# Patient Record
Sex: Male | Born: 1960 | Race: White | Hispanic: No | Marital: Married
Health system: Southern US, Community
[De-identification: ages and names within clinical notes are randomized; demographics above are authoritative.]

## PROBLEM LIST (undated history)

## (undated) DIAGNOSIS — E669 Obesity, unspecified: Secondary | ICD-10-CM

## (undated) DIAGNOSIS — K429 Umbilical hernia without obstruction or gangrene: Secondary | ICD-10-CM

## (undated) DIAGNOSIS — G4730 Sleep apnea, unspecified: Secondary | ICD-10-CM

## (undated) DIAGNOSIS — M1711 Unilateral primary osteoarthritis, right knee: Secondary | ICD-10-CM

## (undated) DIAGNOSIS — M1712 Unilateral primary osteoarthritis, left knee: Secondary | ICD-10-CM

## (undated) DIAGNOSIS — S82899A Other fracture of unspecified lower leg, initial encounter for closed fracture: Secondary | ICD-10-CM

## (undated) DIAGNOSIS — M1990 Unspecified osteoarthritis, unspecified site: Secondary | ICD-10-CM

## (undated) DIAGNOSIS — I10 Essential (primary) hypertension: Secondary | ICD-10-CM

## (undated) DIAGNOSIS — E7800 Pure hypercholesterolemia, unspecified: Secondary | ICD-10-CM

## (undated) HISTORY — PX: COLONOSCOPY W/ BIOPSIES AND POLYPECTOMY: SHX1376

---
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
## 1983-03-02 HISTORY — PX: HERNIA REPAIR: SHX51

## 2016-03-02 ENCOUNTER — Other Ambulatory Visit: Payer: Self-pay | Admitting: Orthopedic Surgery

## 2016-03-04 NOTE — Pre-Procedure Instructions (Signed)
Marcas Babcock  03/04/2016      [id number] - [name], [place] [address] [place] [address] Phone: [phone] Fax: [phone]    Your procedure is scheduled on January 16  Report to [place] at 0830 A.M.  Call this number if you have problems the morning of surgery:  [phone]   Remember:  Do not eat food or drink liquids after midnight.   Take these medicines the morning of surgery with A SIP OF WATER cetirizine (ZYRTEC), methocarbamol (ROBAXIN) if needed, traMADol (ULTRAM  7 days prior to surgery STOP taking any celecoxib (CELEBREX)  Aspirin, Aleve, Naproxen, Ibuprofen, Motrin, Advil, Goody's, BC's, all herbal medications, fish oil, and all vitamins    Do not wear jewelry.  Do not wear lotions, powders, or cologne, or deoderant.  Men may shave face and neck.  Do not bring valuables to the hospital.  [facility] is not responsible for any belongings or valuables.  Contacts, dentures or bridgework may not be worn into surgery.  Leave your suitcase in the car.  After surgery it may be brought to your room.  For patients admitted to the hospital, discharge time will be determined by your treatment team.  Patients discharged the day of surgery will not be allowed to drive home.    Special instructions:   [place]- Preparing For Surgery  Before surgery, you can play an important role. Because skin is not sterile, your skin needs to be as free of germs as possible. You can reduce the number of germs on your skin by washing with CHG (chlorahexidine gluconate) Soap before surgery.  CHG is an antiseptic cleaner which kills germs and bonds with the skin to continue killing germs even after washing.  Please do not use if you have an allergy to CHG or antibacterial soaps. If your skin becomes reddened/irritated stop using the CHG.  Do not shave (including legs and underarms) for at least 48 hours prior to  first CHG shower. It is OK to shave your face.  Please follow these instructions carefully.   1. Shower the NIGHT BEFORE SURGERY and the MORNING OF SURGERY with CHG.   2. If you chose to wash your hair, wash your hair first as usual with your normal shampoo.  3. After you shampoo, rinse your hair and body thoroughly to remove the shampoo.  4. Use CHG as you would any other liquid soap. You can apply CHG directly to the skin and wash gently with a scrungie or a clean washcloth.   5. Apply the CHG Soap to your body ONLY FROM THE NECK DOWN.  Do not use on open wounds or open sores. Avoid contact with your eyes, ears, mouth and genitals (private parts). Wash genitals (private parts) with your normal soap.  6. Wash thoroughly, paying special attention to the area where your surgery will be performed.  7. Thoroughly rinse your body with warm water from the neck down.  8. DO NOT shower/wash with your normal soap after using and rinsing off the CHG Soap.  9. Pat yourself dry with a CLEAN TOWEL.   10. Wear CLEAN PAJAMAS   11. Place CLEAN SHEETS on your bed the night of your first shower and DO NOT SLEEP WITH PETS.    Day of Surgery: Do not apply any deodorants/lotions. Please wear clean clothes to the hospital/surgery center.      Please read over the following fact sheets that you  were given.

## 2016-03-05 ENCOUNTER — Encounter (HOSPITAL_COMMUNITY)
Admission: RE | Admit: 2016-03-05 | Discharge: 2016-03-05 | Disposition: A | Discharge status: Home / Self Care | Payer: 59 | Source: Ambulatory Visit | Attending: Orthopedic Surgery | Admitting: Orthopedic Surgery

## 2016-03-05 ENCOUNTER — Encounter (HOSPITAL_COMMUNITY): Payer: Self-pay

## 2016-03-05 DIAGNOSIS — R7989 Other specified abnormal findings of blood chemistry: Secondary | ICD-10-CM | POA: Insufficient documentation

## 2016-03-05 HISTORY — DX: Unspecified osteoarthritis, unspecified site: M19.90

## 2016-03-05 HISTORY — DX: Pure hypercholesterolemia, unspecified: E78.00

## 2016-03-05 HISTORY — DX: Essential (primary) hypertension: I10

## 2016-03-05 HISTORY — DX: Other fracture of unspecified lower leg, initial encounter for closed fracture: S82.899A

## 2016-03-05 LAB — SURGICAL PCR SCREEN
MRSA, PCR: NEGATIVE
Staphylococcus aureus: NEGATIVE

## 2016-03-05 LAB — BASIC METABOLIC PANEL
ANION GAP: 10 (ref 5–15)
BUN: 13 mg/dL (ref 6–20)
CALCIUM: 9.4 mg/dL (ref 8.9–10.3)
CHLORIDE: 104 mmol/L (ref 101–111)
CO2: 24 mmol/L (ref 22–32)
Creatinine, Ser: 0.78 mg/dL (ref 0.61–1.24)
GFR calc Af Amer: >60 mL/min (ref >60)
GFR calc non Af Amer: >60 mL/min (ref >60)
Glucose, Bld: 91 mg/dL (ref 65–99)
POTASSIUM: 3.8 mmol/L (ref 3.5–5.1)
Sodium: 138 mmol/L (ref 135–145)

## 2016-03-05 LAB — CBC
HEMATOCRIT: 39.9 % (ref 39.0–52.0)
Hemoglobin: 13.6 g/dL (ref 13.0–17.0)
MCH: 30.3 pg (ref 26.0–34.0)
MCHC: 34.1 g/dL (ref 30.0–36.0)
MCV: 88.9 fL (ref 78.0–100.0)
Platelets: 221 10*3/uL (ref 150–400)
RBC: 4.49 MIL/uL (ref 4.22–5.81)
RDW: 12.6 % (ref 11.5–15.5)
WBC: 7.2 10*3/uL (ref 4.0–10.5)

## 2016-03-05 NOTE — Progress Notes (Signed)
PCP - Hillary Gordnier Cardiologist - denies  Chest x-ray - not needed EKG - 03/03/16 - requesting from PCP Stress Test - denies ECHO - denies Cardiac Cath - denies  Will send to anesthesia for review of ekg    Patient denies shortness of breath, fever, cough and chest pain at PAT appointment

## 2016-03-05 NOTE — Progress Notes (Signed)
   03/05/16 1050  OBSTRUCTIVE SLEEP APNEA  Have you ever been diagnosed with sleep apnea through a sleep study? No  Do you snore loudly (loud enough to be heard through closed doors)?  1  Do you often feel tired, fatigued, or sleepy during the daytime (such as falling asleep during driving or talking to someone)? 0  Has anyone observed you stop breathing during your sleep? 0  Do you have, or are you being treated for high blood pressure? 1  BMI more than 35 kg/m2? 1  Age > 50 (1-yes) 1  Neck circumference greater than:Male 16 inches or larger, Male 17inches or larger? 1  Male Gender (Yes=1) 1  Obstructive Sleep Apnea Score 6  Score 5 or greater  Results sent to PCP

## 2016-03-15 MED ORDER — CEFAZOLIN SODIUM 10 G IJ SOLR
3.0000 g | INTRAMUSCULAR | Status: AC
  Filled 2016-03-15: qty 3000

## 2016-03-25 ENCOUNTER — Encounter (HOSPITAL_COMMUNITY): Payer: Self-pay | Admitting: *Deleted

## 2016-03-25 NOTE — Progress Notes (Signed)
   03/25/16 1414  OBSTRUCTIVE SLEEP APNEA  Have you ever been diagnosed with sleep apnea through a sleep study? No  Do you snore loudly (loud enough to be heard through closed doors)?  1  Do you often feel tired, fatigued, or sleepy during the daytime (such as falling asleep during driving or talking to someone)? 0  Has anyone observed you stop breathing during your sleep? 0  Do you have, or are you being treated for high blood pressure? 1  BMI more than 35 kg/m2? 1  Age > 50 (1-yes) 1  Neck circumference greater than:Male 16 inches or larger, Male 17inches or larger? 1  Male Gender (Yes=1) 1  Obstructive Sleep Apnea Score 6

## 2016-03-25 NOTE — Progress Notes (Signed)
Pt SDW-pre-op call completed by pt spouse [name] per DPR. Spouse stated that everything is the same since PAT visit on 03/05/16. Spouse denies pt had a cardiac cath but stated that a stress test was performed >6 years ago. Spouse stated that pt has stopped taking  Aspirin, vitamins, Cellebrex and herbal medications and NSAID's such as  Ibuprofen, Advil, Naproxen, BC and Goody Powder or any medication containing Aspirin per previous pre-op instructions. Spouse verbalized understanding of all pre-op instructions.

## 2016-03-26 ENCOUNTER — Observation Stay (HOSPITAL_COMMUNITY): Payer: 59

## 2016-03-26 ENCOUNTER — Encounter (HOSPITAL_COMMUNITY)
Admission: RE | Disposition: A | Discharge status: Home / Self Care | Payer: Self-pay | Source: Ambulatory Visit | Attending: Orthopedic Surgery

## 2016-03-26 ENCOUNTER — Ambulatory Visit (HOSPITAL_COMMUNITY): Payer: 59 | Admitting: Certified Registered Nurse Anesthetist

## 2016-03-26 ENCOUNTER — Encounter (HOSPITAL_COMMUNITY): Payer: Self-pay | Admitting: *Deleted

## 2016-03-26 ENCOUNTER — Ambulatory Visit (HOSPITAL_COMMUNITY): Payer: 59 | Admitting: Emergency Medicine

## 2016-03-26 ENCOUNTER — Observation Stay (HOSPITAL_COMMUNITY)
Admission: RE | Admit: 2016-03-26 | Discharge: 2016-03-27 | Disposition: A | Discharge status: Home / Self Care | Payer: 59 | Source: Ambulatory Visit | Attending: Orthopedic Surgery | Admitting: Orthopedic Surgery

## 2016-03-26 DIAGNOSIS — Z86010 Personal history of colonic polyps: Secondary | ICD-10-CM | POA: Diagnosis not present

## 2016-03-26 DIAGNOSIS — Z6841 Body Mass Index (BMI) 40.0 and over, adult: Secondary | ICD-10-CM | POA: Diagnosis not present

## 2016-03-26 DIAGNOSIS — F17290 Nicotine dependence, other tobacco product, uncomplicated: Secondary | ICD-10-CM | POA: Insufficient documentation

## 2016-03-26 DIAGNOSIS — E7800 Pure hypercholesterolemia, unspecified: Secondary | ICD-10-CM | POA: Diagnosis not present

## 2016-03-26 DIAGNOSIS — Z9889 Other specified postprocedural states: Secondary | ICD-10-CM | POA: Diagnosis not present

## 2016-03-26 DIAGNOSIS — R262 Difficulty in walking, not elsewhere classified: Secondary | ICD-10-CM

## 2016-03-26 DIAGNOSIS — M1712 Unilateral primary osteoarthritis, left knee: Secondary | ICD-10-CM | POA: Diagnosis present

## 2016-03-26 DIAGNOSIS — I10 Essential (primary) hypertension: Secondary | ICD-10-CM | POA: Diagnosis not present

## 2016-03-26 DIAGNOSIS — Z96652 Presence of left artificial knee joint: Secondary | ICD-10-CM

## 2016-03-26 DIAGNOSIS — M25662 Stiffness of left knee, not elsewhere classified: Secondary | ICD-10-CM

## 2016-03-26 HISTORY — DX: Morbid (severe) obesity due to excess calories: E66.01

## 2016-03-26 HISTORY — DX: Obesity, unspecified: E66.9

## 2016-03-26 HISTORY — PX: REPLACEMENT UNICONDYLAR JOINT KNEE: SUR1227

## 2016-03-26 HISTORY — PX: PARTIAL KNEE ARTHROPLASTY: SHX2174

## 2016-03-26 HISTORY — DX: Unilateral primary osteoarthritis, left knee: M17.12

## 2016-03-26 LAB — CBC
HEMATOCRIT: 41.8 % (ref 39.0–52.0)
Hemoglobin: 14 g/dL (ref 13.0–17.0)
MCH: 30.6 pg (ref 26.0–34.0)
MCHC: 33.5 g/dL (ref 30.0–36.0)
MCV: 91.3 fL (ref 78.0–100.0)
Platelets: 269 10*3/uL (ref 150–400)
RBC: 4.58 MIL/uL (ref 4.22–5.81)
RDW: 13.1 % (ref 11.5–15.5)
WBC: 8.3 10*3/uL (ref 4.0–10.5)

## 2016-03-26 LAB — BASIC METABOLIC PANEL
Anion gap: 10 (ref 5–15)
BUN: 17 mg/dL (ref 6–20)
CO2: 27 mmol/L (ref 22–32)
Calcium: 9.3 mg/dL (ref 8.9–10.3)
Chloride: 102 mmol/L (ref 101–111)
Creatinine, Ser: 0.93 mg/dL (ref 0.61–1.24)
GFR calc Af Amer: >60 mL/min (ref >60)
GFR calc non Af Amer: >60 mL/min (ref >60)
GLUCOSE: 101 mg/dL — AB (ref 65–99)
POTASSIUM: 3.7 mmol/L (ref 3.5–5.1)
Sodium: 139 mmol/L (ref 135–145)

## 2016-03-26 SURGERY — ARTHROPLASTY, KNEE, UNICOMPARTMENTAL
Anesthesia: Spinal | Site: Knee | Laterality: Left

## 2016-03-26 MED ORDER — DIPHENHYDRAMINE HCL 12.5 MG/5ML PO ELIX: 12.5000 mg | ORAL_SOLUTION | ORAL | Status: DC | PRN

## 2016-03-26 MED ORDER — KETOROLAC TROMETHAMINE 30 MG/ML IJ SOLN
INTRAMUSCULAR | Status: AC
  Filled 2016-03-26: qty 1

## 2016-03-26 MED ORDER — ACETAMINOPHEN 650 MG RE SUPP: 650.0000 mg | Freq: Four times a day (QID) | RECTAL | Status: DC | PRN

## 2016-03-26 MED ORDER — METHOCARBAMOL 500 MG PO TABS
ORAL_TABLET | ORAL | Status: AC
  Administered 2016-03-26: 500 mg via ORAL
  Filled 2016-03-26: qty 1

## 2016-03-26 MED ORDER — LISINOPRIL-HYDROCHLOROTHIAZIDE 20-12.5 MG PO TABS: 1.0000 | ORAL_TABLET | Freq: Two times a day (BID) | ORAL | Status: DC

## 2016-03-26 MED ORDER — MIDAZOLAM HCL 2 MG/2ML IJ SOLN
INTRAMUSCULAR | Status: AC
  Filled 2016-03-26: qty 2

## 2016-03-26 MED ORDER — METOCLOPRAMIDE HCL 5 MG/ML IJ SOLN: 10.0000 mg | Freq: Once | INTRAMUSCULAR | Status: DC | PRN

## 2016-03-26 MED ORDER — PROPOFOL 1000 MG/100ML IV EMUL
INTRAVENOUS | Status: AC
  Filled 2016-03-26: qty 300

## 2016-03-26 MED ORDER — 0.9 % SODIUM CHLORIDE (POUR BTL) OPTIME
TOPICAL | Status: DC | PRN
  Administered 2016-03-26: 1000 mL

## 2016-03-26 MED ORDER — MAGNESIUM CITRATE PO SOLN: 1.0000 | Freq: Once | ORAL | Status: DC | PRN

## 2016-03-26 MED ORDER — OXYCODONE HCL 5 MG PO TABS
ORAL_TABLET | ORAL | Status: AC
  Filled 2016-03-26: qty 2

## 2016-03-26 MED ORDER — LACTATED RINGERS IV SOLN
INTRAVENOUS | Status: DC
  Administered 2016-03-26: 12:00:00 via INTRAVENOUS

## 2016-03-26 MED ORDER — ONDANSETRON HCL 4 MG PO TABS: 4.0000 mg | ORAL_TABLET | Freq: Three times a day (TID) | ORAL | 0 refills | Status: DC | PRN

## 2016-03-26 MED ORDER — RIVAROXABAN 10 MG PO TABS
10.0000 mg | ORAL_TABLET | Freq: Every day | ORAL | Status: DC
  Administered 2016-03-27: 10 mg via ORAL
  Filled 2016-03-26: qty 1

## 2016-03-26 MED ORDER — TRAMADOL HCL 50 MG PO TABS
50.0000 mg | ORAL_TABLET | Freq: Three times a day (TID) | ORAL | Status: DC | PRN
  Administered 2016-03-26: 50 mg via ORAL

## 2016-03-26 MED ORDER — HYDROMORPHONE HCL 1 MG/ML IJ SOLN
INTRAMUSCULAR | Status: AC
  Filled 2016-03-26: qty 0.5

## 2016-03-26 MED ORDER — METHOCARBAMOL 500 MG PO TABS
500.0000 mg | ORAL_TABLET | Freq: Four times a day (QID) | ORAL | Status: DC | PRN
  Administered 2016-03-26 – 2016-03-27 (×2): 500 mg via ORAL
  Filled 2016-03-26: qty 1

## 2016-03-26 MED ORDER — SODIUM CHLORIDE 0.9 % IR SOLN
Status: DC | PRN
  Administered 2016-03-26: 1000 mL

## 2016-03-26 MED ORDER — HYDROMORPHONE HCL 1 MG/ML IJ SOLN
1.0000 mg | INTRAMUSCULAR | Status: AC | PRN
  Administered 2016-03-26: 1 mg via INTRAVENOUS

## 2016-03-26 MED ORDER — SENNA-DOCUSATE SODIUM 8.6-50 MG PO TABS: 2.0000 | ORAL_TABLET | Freq: Every day | ORAL | 1 refills | Status: DC

## 2016-03-26 MED ORDER — LISINOPRIL 20 MG PO TABS
20.0000 mg | ORAL_TABLET | Freq: Every day | ORAL | Status: DC
  Administered 2016-03-27: 20 mg via ORAL
  Filled 2016-03-26: qty 1

## 2016-03-26 MED ORDER — KETOROLAC TROMETHAMINE 15 MG/ML IJ SOLN
7.5000 mg | Freq: Four times a day (QID) | INTRAMUSCULAR | Status: AC
  Administered 2016-03-26 – 2016-03-27 (×4): 7.5 mg via INTRAVENOUS
  Filled 2016-03-26 (×3): qty 1

## 2016-03-26 MED ORDER — PHENYLEPHRINE 40 MCG/ML (10ML) SYRINGE FOR IV PUSH (FOR BLOOD PRESSURE SUPPORT)
PREFILLED_SYRINGE | INTRAVENOUS | Status: AC
  Filled 2016-03-26: qty 10

## 2016-03-26 MED ORDER — ACETAMINOPHEN 325 MG PO TABS: 650.0000 mg | ORAL_TABLET | Freq: Four times a day (QID) | ORAL | Status: DC | PRN

## 2016-03-26 MED ORDER — MEPERIDINE HCL 25 MG/ML IJ SOLN: 6.2500 mg | INTRAMUSCULAR | Status: DC | PRN

## 2016-03-26 MED ORDER — ADULT MULTIVITAMIN W/MINERALS CH
1.0000 | ORAL_TABLET | Freq: Every day | ORAL | Status: DC
  Administered 2016-03-27: 1 via ORAL
  Filled 2016-03-26: qty 1

## 2016-03-26 MED ORDER — HYDROMORPHONE HCL 1 MG/ML IJ SOLN
INTRAMUSCULAR | Status: AC
  Administered 2016-03-26: 0.5 mg via INTRAVENOUS
  Filled 2016-03-26: qty 0.5

## 2016-03-26 MED ORDER — METOCLOPRAMIDE HCL 5 MG/ML IJ SOLN: 5.0000 mg | Freq: Three times a day (TID) | INTRAMUSCULAR | Status: DC | PRN

## 2016-03-26 MED ORDER — LORATADINE 10 MG PO TABS
10.0000 mg | ORAL_TABLET | Freq: Every day | ORAL | Status: DC
  Administered 2016-03-27: 10 mg via ORAL
  Filled 2016-03-26: qty 1

## 2016-03-26 MED ORDER — RIVAROXABAN 10 MG PO TABS: 10.0000 mg | ORAL_TABLET | Freq: Every day | ORAL | 0 refills | Status: DC

## 2016-03-26 MED ORDER — BISACODYL 10 MG RE SUPP: 10.0000 mg | Freq: Every day | RECTAL | Status: DC | PRN

## 2016-03-26 MED ORDER — FENTANYL CITRATE (PF) 100 MCG/2ML IJ SOLN
INTRAMUSCULAR | Status: AC
  Administered 2016-03-26: 50 ug via INTRAVENOUS
  Filled 2016-03-26: qty 2

## 2016-03-26 MED ORDER — BUPIVACAINE HCL (PF) 0.25 % IJ SOLN
INTRAMUSCULAR | Status: AC
  Filled 2016-03-26: qty 30

## 2016-03-26 MED ORDER — ONDANSETRON HCL 4 MG PO TABS: 4.0000 mg | ORAL_TABLET | Freq: Four times a day (QID) | ORAL | Status: DC | PRN

## 2016-03-26 MED ORDER — ADULT MULTIVITAMIN W/MINERALS CH: 1.0000 | ORAL_TABLET | Freq: Two times a day (BID) | ORAL | Status: DC

## 2016-03-26 MED ORDER — DEXTROSE 5 % IV SOLN
INTRAVENOUS | Status: DC | PRN
  Administered 2016-03-26: 3 g via INTRAVENOUS

## 2016-03-26 MED ORDER — OXYCODONE-ACETAMINOPHEN 10-325 MG PO TABS: 1.0000 | ORAL_TABLET | Freq: Four times a day (QID) | ORAL | 0 refills | Status: DC | PRN

## 2016-03-26 MED ORDER — MIDAZOLAM HCL 2 MG/2ML IJ SOLN
INTRAMUSCULAR | Status: AC
  Administered 2016-03-26: 2 mg
  Filled 2016-03-26: qty 2

## 2016-03-26 MED ORDER — FENTANYL CITRATE (PF) 100 MCG/2ML IJ SOLN
INTRAMUSCULAR | Status: AC
  Filled 2016-03-26: qty 2

## 2016-03-26 MED ORDER — PHENYLEPHRINE HCL 10 MG/ML IJ SOLN
INTRAMUSCULAR | Status: DC | PRN
  Administered 2016-03-26: 50 ug/min via INTRAVENOUS

## 2016-03-26 MED ORDER — MIDAZOLAM HCL 5 MG/5ML IJ SOLN
INTRAMUSCULAR | Status: DC | PRN
  Administered 2016-03-26: 2 mg via INTRAVENOUS

## 2016-03-26 MED ORDER — MENTHOL 3 MG MT LOZG
1.0000 | LOZENGE | OROMUCOSAL | Status: DC | PRN
Start: 2016-03-26 — End: 2016-03-27

## 2016-03-26 MED ORDER — HYDROCHLOROTHIAZIDE 12.5 MG PO CAPS
12.5000 mg | ORAL_CAPSULE | Freq: Every day | ORAL | Status: DC
  Administered 2016-03-27: 12.5 mg via ORAL
  Filled 2016-03-26: qty 1

## 2016-03-26 MED ORDER — ALUM & MAG HYDROXIDE-SIMETH 200-200-20 MG/5ML PO SUSP: 30.0000 mL | ORAL | Status: DC | PRN

## 2016-03-26 MED ORDER — METHOCARBAMOL 500 MG PO TABS: 500.0000 mg | ORAL_TABLET | Freq: Three times a day (TID) | ORAL | 0 refills | Status: DC | PRN

## 2016-03-26 MED ORDER — SENNA 8.6 MG PO TABS
1.0000 | ORAL_TABLET | Freq: Two times a day (BID) | ORAL | Status: DC
  Administered 2016-03-27: 8.6 mg via ORAL
  Filled 2016-03-26: qty 1

## 2016-03-26 MED ORDER — ONDANSETRON HCL 4 MG/2ML IJ SOLN: 4.0000 mg | Freq: Four times a day (QID) | INTRAMUSCULAR | Status: DC | PRN

## 2016-03-26 MED ORDER — DEXAMETHASONE SODIUM PHOSPHATE 10 MG/ML IJ SOLN
10.0000 mg | Freq: Once | INTRAMUSCULAR | Status: AC
  Administered 2016-03-27: 10 mg via INTRAVENOUS
  Filled 2016-03-26: qty 1

## 2016-03-26 MED ORDER — SIMVASTATIN 40 MG PO TABS: 40.0000 mg | ORAL_TABLET | Freq: Every day | ORAL | Status: DC

## 2016-03-26 MED ORDER — KETOROLAC TROMETHAMINE 15 MG/ML IJ SOLN
INTRAMUSCULAR | Status: AC
  Filled 2016-03-26: qty 1

## 2016-03-26 MED ORDER — BUPIVACAINE-EPINEPHRINE (PF) 0.5% -1:200000 IJ SOLN
INTRAMUSCULAR | Status: DC | PRN
  Administered 2016-03-26: 30 mL via PERINEURAL

## 2016-03-26 MED ORDER — HYDROMORPHONE HCL 2 MG/ML IJ SOLN
0.5000 mg | INTRAMUSCULAR | Status: DC | PRN
  Administered 2016-03-26: 0.5 mg via INTRAVENOUS
  Filled 2016-03-26: qty 1

## 2016-03-26 MED ORDER — PHENYLEPHRINE HCL 10 MG/ML IJ SOLN
INTRAMUSCULAR | Status: DC | PRN
  Administered 2016-03-26: 80 ug via INTRAVENOUS

## 2016-03-26 MED ORDER — FENTANYL CITRATE (PF) 100 MCG/2ML IJ SOLN
25.0000 ug | INTRAMUSCULAR | Status: DC | PRN
  Administered 2016-03-26 (×3): 50 ug via INTRAVENOUS

## 2016-03-26 MED ORDER — TRAMADOL HCL 50 MG PO TABS
ORAL_TABLET | ORAL | Status: AC
  Filled 2016-03-26: qty 1

## 2016-03-26 MED ORDER — LACTATED RINGERS IV SOLN: INTRAVENOUS | Status: DC

## 2016-03-26 MED ORDER — FENTANYL CITRATE (PF) 100 MCG/2ML IJ SOLN
INTRAMUSCULAR | Status: DC | PRN
  Administered 2016-03-26: 100 ug via INTRAVENOUS

## 2016-03-26 MED ORDER — GABAPENTIN 300 MG PO CAPS
300.0000 mg | ORAL_CAPSULE | Freq: Two times a day (BID) | ORAL | Status: DC
  Administered 2016-03-26 – 2016-03-27 (×2): 300 mg via ORAL
  Filled 2016-03-26 (×2): qty 1

## 2016-03-26 MED ORDER — PROPOFOL 500 MG/50ML IV EMUL
INTRAVENOUS | Status: DC | PRN
  Administered 2016-03-26: 75 ug/kg/min via INTRAVENOUS

## 2016-03-26 MED ORDER — CEFAZOLIN SODIUM 1 G IJ SOLR
INTRAMUSCULAR | Status: AC
  Filled 2016-03-26: qty 30

## 2016-03-26 MED ORDER — HYDROMORPHONE HCL 2 MG/ML IJ SOLN
INTRAMUSCULAR | Status: AC
  Filled 2016-03-26: qty 1

## 2016-03-26 MED ORDER — CALCIUM CITRATE-VITAMIN D 500-400 MG-UNIT PO CHEW
1.0000 | CHEWABLE_TABLET | Freq: Two times a day (BID) | ORAL | Status: DC
  Filled 2016-03-26: qty 1

## 2016-03-26 MED ORDER — FENTANYL CITRATE (PF) 100 MCG/2ML IJ SOLN
INTRAMUSCULAR | Status: AC
  Administered 2016-03-26: 100 ug
  Filled 2016-03-26: qty 2

## 2016-03-26 MED ORDER — HYDROMORPHONE HCL 1 MG/ML IJ SOLN
0.5000 mg | INTRAMUSCULAR | Status: DC | PRN
  Administered 2016-03-26 (×2): 0.5 mg via INTRAVENOUS

## 2016-03-26 MED ORDER — METOCLOPRAMIDE HCL 5 MG PO TABS: 5.0000 mg | ORAL_TABLET | Freq: Three times a day (TID) | ORAL | Status: DC | PRN

## 2016-03-26 MED ORDER — DOCUSATE SODIUM 100 MG PO CAPS
100.0000 mg | ORAL_CAPSULE | Freq: Two times a day (BID) | ORAL | Status: DC
  Administered 2016-03-26 – 2016-03-27 (×2): 100 mg via ORAL
  Filled 2016-03-26 (×2): qty 1

## 2016-03-26 MED ORDER — LIDOCAINE HCL (CARDIAC) 20 MG/ML IV SOLN
INTRAVENOUS | Status: DC | PRN
  Administered 2016-03-26: 40 mg via INTRAVENOUS

## 2016-03-26 MED ORDER — OXYCODONE HCL 5 MG PO TABS
5.0000 mg | ORAL_TABLET | ORAL | Status: DC | PRN
  Administered 2016-03-26 – 2016-03-27 (×5): 10 mg via ORAL
  Filled 2016-03-26 (×3): qty 2

## 2016-03-26 MED ORDER — PHENYLEPHRINE HCL 10 MG/ML IJ SOLN
INTRAMUSCULAR | Status: AC
  Filled 2016-03-26: qty 1

## 2016-03-26 MED ORDER — METHOCARBAMOL 1000 MG/10ML IJ SOLN
500.0000 mg | Freq: Four times a day (QID) | INTRAMUSCULAR | Status: DC | PRN
  Filled 2016-03-26: qty 5

## 2016-03-26 MED ORDER — POLYETHYLENE GLYCOL 3350 17 G PO PACK: 17.0000 g | PACK | Freq: Every day | ORAL | Status: DC | PRN

## 2016-03-26 MED ORDER — POTASSIUM CHLORIDE IN NACL 20-0.45 MEQ/L-% IV SOLN
INTRAVENOUS | Status: DC
  Administered 2016-03-26: 23:00:00 via INTRAVENOUS
  Filled 2016-03-26 (×2): qty 1000

## 2016-03-26 MED ORDER — PROPOFOL 10 MG/ML IV BOLUS
INTRAVENOUS | Status: DC | PRN
  Administered 2016-03-26: 20 mg via INTRAVENOUS

## 2016-03-26 MED ORDER — CEFAZOLIN SODIUM-DEXTROSE 2-4 GM/100ML-% IV SOLN
2.0000 g | Freq: Four times a day (QID) | INTRAVENOUS | Status: AC
  Administered 2016-03-26 – 2016-03-27 (×2): 2 g via INTRAVENOUS
  Filled 2016-03-26 (×2): qty 100

## 2016-03-26 MED ORDER — LACTATED RINGERS IV SOLN
INTRAVENOUS | Status: DC | PRN
  Administered 2016-03-26 (×2): via INTRAVENOUS

## 2016-03-26 MED ORDER — PHENOL 1.4 % MT LIQD
1.0000 | OROMUCOSAL | Status: DC | PRN
Start: 2016-03-26 — End: 2016-03-27

## 2016-03-26 SURGICAL SUPPLY — 56 items
BANDAGE ESMARK 6X9 LF (GAUZE/BANDAGES/DRESSINGS) ×1 IMPLANT
BNDG ELASTIC 6X15 VLCR STRL LF (GAUZE/BANDAGES/DRESSINGS) ×3 IMPLANT
BNDG ESMARK 6X9 LF (GAUZE/BANDAGES/DRESSINGS) ×3
BONE CEMENT PALACOSE (Cement) ×3 IMPLANT
BOWL SMART MIX CTS (DISPOSABLE) ×3 IMPLANT
CAPT KNEE PARTIAL 2 ×3 IMPLANT
CEMENT BONE PALACOSE (Cement) ×1 IMPLANT
CLOSURE STERI-STRIP 1/2X4 (GAUZE/BANDAGES/DRESSINGS) ×1
CLSR STERI-STRIP ANTIMIC 1/2X4 (GAUZE/BANDAGES/DRESSINGS) ×2 IMPLANT
COVER SURGICAL LIGHT HANDLE (MISCELLANEOUS) ×3 IMPLANT
CUFF TOURNIQUET SINGLE 34IN LL (TOURNIQUET CUFF) ×3 IMPLANT
DRAPE EXTREMITY T 121X128X90 (DRAPE) IMPLANT
DRAPE HALF SHEET 40X57 (DRAPES) ×3 IMPLANT
DRAPE PROXIMA HALF (DRAPES) IMPLANT
DRAPE U-SHAPE 47X51 STRL (DRAPES) ×3 IMPLANT
DRSG PAD ABDOMINAL 8X10 ST (GAUZE/BANDAGES/DRESSINGS) ×3 IMPLANT
DURAPREP 26ML APPLICATOR (WOUND CARE) ×3 IMPLANT
ELECT CAUTERY BLADE 6.4 (BLADE) ×3 IMPLANT
ELECT REM PT RETURN 9FT ADLT (ELECTROSURGICAL) ×3
ELECTRODE REM PT RTRN 9FT ADLT (ELECTROSURGICAL) ×1 IMPLANT
GAUZE SPONGE 4X4 12PLY STRL (GAUZE/BANDAGES/DRESSINGS) ×3 IMPLANT
GLOVE BIOGEL PI ORTHO PRO SZ8 (GLOVE) ×4
GLOVE ORTHO TXT STRL SZ7.5 (GLOVE) ×3 IMPLANT
GLOVE PI ORTHO PRO STRL SZ8 (GLOVE) ×2 IMPLANT
GLOVE SURG ORTHO 8.0 STRL STRW (GLOVE) ×3 IMPLANT
GOWN STRL REUS W/ TWL XL LVL3 (GOWN DISPOSABLE) ×1 IMPLANT
GOWN STRL REUS W/TWL 2XL LVL3 (GOWN DISPOSABLE) ×3 IMPLANT
GOWN STRL REUS W/TWL XL LVL3 (GOWN DISPOSABLE) ×2
HANDPIECE INTERPULSE COAX TIP (DISPOSABLE) ×2
HOOD PEEL AWAY FACE SHEILD DIS (HOOD) ×6 IMPLANT
IMMOBILIZER KNEE 22 UNIV (SOFTGOODS) ×3 IMPLANT
KIT BASIN OR (CUSTOM PROCEDURE TRAY) ×3 IMPLANT
KIT ROOM TURNOVER OR (KITS) ×3 IMPLANT
MANIFOLD NEPTUNE II (INSTRUMENTS) ×3 IMPLANT
NEEDLE HYPO 21X1.5 SAFETY (NEEDLE) IMPLANT
NS IRRIG 1000ML POUR BTL (IV SOLUTION) ×3 IMPLANT
PACK TOTAL JOINT (CUSTOM PROCEDURE TRAY) ×3 IMPLANT
PAD ABD 8X10 STRL (GAUZE/BANDAGES/DRESSINGS) ×3 IMPLANT
PAD ARMBOARD 7.5X6 YLW CONV (MISCELLANEOUS) ×6 IMPLANT
PAD CAST 4YDX4 CTTN HI CHSV (CAST SUPPLIES) ×1 IMPLANT
PADDING CAST COTTON 4X4 STRL (CAST SUPPLIES) ×2
PADDING CAST COTTON 6X4 STRL (CAST SUPPLIES) ×3 IMPLANT
PENCIL BUTTON HOLSTER BLD 10FT (ELECTRODE) ×3 IMPLANT
SET HNDPC FAN SPRY TIP SCT (DISPOSABLE) ×1 IMPLANT
SPONGE GAUZE 4X4 12PLY STER LF (GAUZE/BANDAGES/DRESSINGS) ×3 IMPLANT
SUCTION FRAZIER HANDLE 10FR (MISCELLANEOUS) ×2
SUCTION TUBE FRAZIER 10FR DISP (MISCELLANEOUS) ×1 IMPLANT
SUT MNCRL AB 4-0 PS2 18 (SUTURE) IMPLANT
SUT VIC AB 0 CT1 27 (SUTURE) ×2
SUT VIC AB 0 CT1 27XBRD ANBCTR (SUTURE) ×1 IMPLANT
SUT VIC AB 1 CT1 27 (SUTURE) ×2
SUT VIC AB 1 CT1 27XBRD ANBCTR (SUTURE) ×1 IMPLANT
SUT VIC AB 3-0 SH 8-18 (SUTURE) ×3 IMPLANT
SYR CONTROL 10ML LL (SYRINGE) IMPLANT
TOWEL OR 17X24 6PK STRL BLUE (TOWEL DISPOSABLE) ×3 IMPLANT
TOWEL OR 17X26 10 PK STRL BLUE (TOWEL DISPOSABLE) ×3 IMPLANT

## 2016-03-26 NOTE — Anesthesia Procedure Notes (Addendum)
Anesthesia Regional Block:  Adductor canal block  Pre-Anesthetic Checklist: ,, timeout performed, Correct Patient, Correct Site, Correct Laterality, Correct Procedure, Correct Position, site marked, Risks and benefits discussed,  Surgical consent,  Pre-op evaluation,  At surgeon's request and post-op pain management  Laterality: Left and Lower  Prep: Maximum Sterile Barrier Precautions used, chloraprep       Needles:  Injection technique: Single-shot  Needle Type: Echogenic Stimulator Needle     Needle Length: 10cm 10 cm Needle Gauge: 21 G    Additional Needles:  Procedures: ultrasound guided (picture in chart) Adductor canal block Narrative:  Start time: 03/26/2016 12:11 PM End time: 03/26/2016 12:21 PM Injection made incrementally with aspirations every 5 mL.  Performed by: Personally  Anesthesiologist: Montez Hageman  Additional Notes: Risks, benefits and alternative to block explained extensively.  Patient tolerated procedure well, without complications.

## 2016-03-26 NOTE — Transfer of Care (Signed)
Immediate Anesthesia Transfer of Care Note  Patient: Thomas Clark  Procedure(s) Performed: Procedure(s): UNICOMPARTMENTAL KNEE ARTHROPLASTY (Left)  Patient Location: PACU  Anesthesia Type:Spinal  Level of Consciousness: awake  Airway & Oxygen Therapy: Patient Spontanous Breathing and Patient connected to nasal cannula oxygen  Post-op Assessment: Report given to RN and Post -op Vital signs reviewed and stable  Post vital signs: Reviewed and stable  Last Vitals:  Vitals:   03/26/16 1122 03/26/16 1705  BP: (!) 157/92   Pulse: 84 88  Resp: 18 11  Temp: 36.6 C 36.4 C    Last Pain:  Vitals:   03/26/16 1122  TempSrc: Oral      Patients Stated Pain Goal: 3 ([id number])  Complications: No apparent anesthesia complications

## 2016-03-26 NOTE — Op Note (Signed)
03/26/2016  5:04 PM  PATIENT:  Thomas Clark    PRE-OPERATIVE DIAGNOSIS:   LEFT KNEE anteromedial osteoarthritis  POST-OPERATIVE DIAGNOSIS:  Same  PROCEDURE:  UNICOMPARTMENTAL KNEE ARTHROPLASTY  SURGEON:  [name], MD  PHYSICIAN ASSISTANT: Joya Gaskins, OPA-C, present and scrubbed throughout the case, critical for completion in a timely fashion, and for retraction, instrumentation, and closure.  ANESTHESIA:   Spinal  PREOPERATIVE INDICATIONS:  Quentine Ragin is a  56 y.o. male with a diagnosis of OA LEFT KNEE who failed conservative measures and elected for surgical management.  He also had morbid obesity with a BMI of 52, and was counseled on weight loss and increased perioperative risk.  The risks benefits and alternatives were discussed with the patient preoperatively including but not limited to the risks of infection, bleeding, nerve injury, cardiopulmonary complications, blood clots, the need for revision surgery, among others, and the patient was willing to proceed.  OPERATIVE IMPLANTS: Biomet Oxford mobile bearing medial compartment arthroplasty femur size medium, tibia size B, bearing size 4.  OPERATIVE FINDINGS: Endstage grade 4 medial compartment osteoarthritis. No significant changes in the lateral or patellofemoral joint.  The ACL was intact.  OPERATIVE PROCEDURE: The patient was brought to the operating room placed in supine position. Spinal anesthesia was administered. IV antibiotics were given. The lower extremity was placed in the legholder and prepped and draped in usual sterile fashion.  Time out was performed.  The leg was elevated and exsanguinated and the tourniquet was inflated. Anteromedial incision was performed, and I took care to preserve the MCL. Parapatellar incision was carried out, and the osteophytes were excised, along with the medial meniscus and a small portion of the fat pad.  The extra medullary tibial cutting jig was applied, using the  spoon and the 29mm G-Clamp and the 2 mm shim, and I took care to protect the anterior cruciate ligament insertion and the tibial spine. The medial collateral ligament was also protected, and I resected my proximal tibia, matching the anatomic slope.   The proximal tibial bony cut was removed in one piece, and I turned my attention to the femur.  The intramedullary femoral rod was placed using the drill, and then using the appropriate reference, I assembled the femoral jig, setting my posterior cutting block. I resected my posterior femur, used the 0 spigot for the anterior femur, and then measured my gap.   I then used the appropriate mill to match the extension gap to the flexion gap. The second milling was at a 2.  The gaps were then measured again with the appropriate feeler gauges. Once I had balanced flexion and extension gaps, I then completed the preparation of the femur.  I milled off the anterior aspect of the distal femur to prevent impingement. I also exposed the tibia, and selected the above-named component, and then used the cutting jig to prepare the keel slot on the tibia. I also used the awl to curette out the bone to complete the preparation of the keel. The back wall was intact.  I then placed trial components, and it was found to have excellent motion, and appropriate balance.  I then cemented the components into place, cementing the tibia first, removing all excess cement, and then cementing the femur.  All loose cement was removed.  The real polyethylene insert was applied manually, and the knee was taken through functional range of motion, and found to have excellent stability and restoration of joint motion, with excellent balance.  The wounds  were irrigated copiously, and the parapatellar tissue closed with Vicryl, followed by Vicryl for the subcutaneous tissue, with routine closure with Steri-Strips and sterile gauze.  The tourniquet was released, and the patient was  awakened and extubated and returned to PACU in stable and satisfactory condition. There were no complications.

## 2016-03-26 NOTE — Progress Notes (Signed)
Orthopedic Tech Progress Note Patient Details:  Thomas Clark 06-22-1960 [id number]   Applied Overhead Frame with Trapeze bar.   Kristopher Oppenheim 03/26/2016, 10:13 PM

## 2016-03-26 NOTE — H&P (Signed)
PREOPERATIVE H&P  Chief Complaint: OA LEFT KNEE  HPI: Thomas Clark is a 56 y.o. male who presents for preoperative history and physical with a diagnosis of OA LEFT KNEE. Symptoms are rated as moderate to severe, and have been worsening.  This is significantly impairing activities of daily living.  He has elected for surgical management.   He has failed injections, activity modification, anti-inflammatories, and assistive devices.  Preoperative X-rays demonstrate end stage degenerative changes with osteophyte formation, loss of joint space, subchondral sclerosis.  Past Medical History:  Diagnosis Date  . Ankle fracture   . Arthritis   . High cholesterol   . Hypertension   . Obesity    Past Surgical History:  Procedure Laterality Date  . COLONOSCOPY W/ BIOPSIES AND POLYPECTOMY    . HERNIA REPAIR     Social History   Social History  . Marital status: Married    Spouse name: N/A  . Number of children: N/A  . Years of education: N/A   Social History Main Topics  . Smoking status: Current Some Day Smoker    Types: Cigars  . Smokeless tobacco: Never Used  . Alcohol use Yes     Comment: occasional  . Drug use: No  . Sexual activity: Not Asked   Other Topics Concern  . None   Social History Narrative  . None   History reviewed. No pertinent family history. Allergies  Allergen Reactions  . Aleve [Naproxen Sodium] Other (See Comments)    GI bleeds   Prior to Admission medications   Medication Sig Start Date End Date Taking? Authorizing Provider  celecoxib (CELEBREX) 200 MG capsule Take 200 mg by mouth daily.   Yes Historical Provider, MD  cetirizine (ZYRTEC) 10 MG tablet Take 10 mg by mouth daily.   Yes Historical Provider, MD  gabapentin (NEURONTIN) 300 MG capsule Take 300 mg by mouth 2 (two) times daily.   Yes Historical Provider, MD  lisinopril-hydrochlorothiazide (PRINZIDE,ZESTORETIC) 20-12.5 MG tablet Take 1 tablet by mouth 2 (two) times daily.   Yes Historical  Provider, MD  methocarbamol (ROBAXIN) 500 MG tablet Take 500 mg by mouth every 8 (eight) hours as needed for muscle spasms.   Yes Historical Provider, MD  Multiple Vitamin (MULTIVITAMIN WITH MINERALS) TABS tablet Take 1 tablet by mouth 2 (two) times daily.   Yes Historical Provider, MD  simvastatin (ZOCOR) 40 MG tablet Take 40 mg by mouth daily at 6 PM.   Yes Historical Provider, MD  traMADol (ULTRAM) 50 MG tablet Take 50 mg by mouth 3 (three) times daily as needed for moderate pain.   Yes Historical Provider, MD  Wheat Dextrin-Calcium ([facility] EASY FIBER/CALCIUM) CHEW Chew 2 tablets by mouth 2 (two) times daily.   Yes Historical Provider, MD     Positive ROS: All other systems have been reviewed and were otherwise negative with the exception of those mentioned in the HPI and as above.  Physical Exam: General: Alert, no acute distress Cardiovascular: No pedal edema Respiratory: No cyanosis, no use of accessory musculature GI: No organomegaly, abdomen is soft and non-tender Skin: No lesions in the area of chief complaint Neurologic: Sensation intact distally Psychiatric: Patient is competent for consent with normal mood and affect Lymphatic: No axillary or cervical lymphadenopathy  MUSCULOSKELETAL: Left knee has range of motion from 0-120. Positive pseudo-laxity to valgus testing. Pain to palpation medially. No pain laterally.  Assessment: OA LEFT KNEE, anteromedial knee osteoarthritis  Coexisting morbid obesity.  Estimated body mass index is 52.37  kg/m as calculated from the following:   Height as of this encounter: 5\' 10"  (1.778 m).   Weight as of this encounter: 165.6 kg (365 lb).    Plan: Plan for Procedure(s): UNICOMPARTMENTAL KNEE ARTHROPLASTY  The risks benefits and alternatives were discussed with the patient including but not limited to the risks of nonoperative treatment, versus surgical intervention including infection, bleeding, nerve injury,  blood clots, cardiopulmonary  complications, morbidity, mortality, among others, and they were willing to proceed.   [name], MD Cell [phone]   03/26/2016 2:56 PM

## 2016-03-26 NOTE — Anesthesia Preprocedure Evaluation (Signed)
Anesthesia Evaluation    Airway Mallampati: III  TM Distance: >3 FB Neck ROM: Full   Comment: large beard Dental no notable dental hx.    Pulmonary Current Smoker,    Pulmonary exam normal breath sounds clear to auscultation       Cardiovascular hypertension, Pt. on medications Normal cardiovascular exam Rhythm:Regular Rate:Normal     Neuro/Psych    GI/Hepatic   Endo/Other  Morbid obesity  Renal/GU      Musculoskeletal   Abdominal   Peds  Hematology   Anesthesia Other Findings   Reproductive/Obstetrics                             Anesthesia Physical Anesthesia Plan  ASA: III  Anesthesia Plan: Spinal   Post-op Pain Management:  Regional for Post-op pain   Induction:   Airway Management Planned: Mask  Additional Equipment:   Intra-op Plan:   Post-operative Plan:   Informed Consent: I have reviewed the patients History and Physical, chart, labs and discussed the procedure including the risks, benefits and alternatives for the proposed anesthesia with the patient or authorized representative who has indicated his/her understanding and acceptance.   Dental advisory given  Plan Discussed with: CRNA  Anesthesia Plan Comments: (Adductor block)        Anesthesia Quick Evaluation

## 2016-03-27 DIAGNOSIS — M1712 Unilateral primary osteoarthritis, left knee: Secondary | ICD-10-CM | POA: Diagnosis not present

## 2016-03-27 LAB — BASIC METABOLIC PANEL
Anion gap: 9 (ref 5–15)
BUN: 13 mg/dL (ref 6–20)
CHLORIDE: 100 mmol/L — AB (ref 101–111)
CO2: 27 mmol/L (ref 22–32)
Calcium: 8.6 mg/dL — ABNORMAL LOW (ref 8.9–10.3)
Creatinine, Ser: 0.87 mg/dL (ref 0.61–1.24)
GFR calc non Af Amer: >60 mL/min (ref >60)
Glucose, Bld: 122 mg/dL — ABNORMAL HIGH (ref 65–99)
Potassium: 3.7 mmol/L (ref 3.5–5.1)
Sodium: 136 mmol/L (ref 135–145)

## 2016-03-27 LAB — CBC
HEMATOCRIT: 38.3 % — AB (ref 39.0–52.0)
HEMOGLOBIN: 12.4 g/dL — AB (ref 13.0–17.0)
MCH: 29.7 pg (ref 26.0–34.0)
MCHC: 32.4 g/dL (ref 30.0–36.0)
MCV: 91.6 fL (ref 78.0–100.0)
Platelets: 242 10*3/uL (ref 150–400)
RBC: 4.18 MIL/uL — ABNORMAL LOW (ref 4.22–5.81)
RDW: 13 % (ref 11.5–15.5)
WBC: 9.7 10*3/uL (ref 4.0–10.5)

## 2016-03-27 NOTE — Care Management Note (Signed)
Case Management Note  Patient Details  Name: Thomas Clark MRN: [id number] Date of Birth: Apr 04, 1960  Subjective/Objective:                  Primary localized osteoarthritis of left knee Action/Plan: Discharge planning Expected Discharge Date:  03/27/16               Expected Discharge Plan:  [place]  In-House Referral:     Discharge planning Services  CM Consult  Post Acute Care Choice:  Home Health Choice offered to:  Patient  DME Arranged:  3-N-1, Walker wide DME Agency:  [place]:  PT [place] Agency:  [facility] (formerly [facility]  Status of Service:  Completed, signed off  If discussed at [employer], dates discussed:    Additional Comments: CM received call from [place] to please arrange for [facility] services for pt. CM spoke with wife of pt, [name] (at bedside) who chooses [facility] to render HHPT (family declines HHRN). Referral called to [facility] rep, Nenzel.  CM notified [place] DME rep, Brad to please deliver the bari commode and wide walker to room prior to discharge.  No other CM needs were communicated. Dellie Catholic, RN 03/27/2016, 2:08 PM

## 2016-03-27 NOTE — Progress Notes (Signed)
Physical Therapy Treatment Note  Pt presents with improved tolerance for gait activities. Performed step training with 1 step into house forward and backward. Pt is able to perform gait with improved cadence and tolerance. Pt will benefit from HHPT upon discharge in order to maximize his functional outcomes.     03/27/16 1231  PT Visit Information  Last PT Received On 03/27/16  Assistance Needed +1  History of Present Illness Pt is a 56 yo male admitted on 03/26/16 for left unicompartmental knee arthroplasty. 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 PMH significant for ankle fx, OA, HLD, HTN, obesity.   Subjective Data  Subjective pt states that he is feeling drugged.   Patient Stated Goal to get back to work  Precautions  Precautions Knee  Precaution Booklet Issued Yes (comment)  Precaution Comments exercises handout given and reviewed  Required Braces or Orthoses Knee Immobilizer - Left  Knee Immobilizer - Left Other (comment);On when out of bed or walking  Restrictions  Weight Bearing Restrictions Yes  LLE Weight Bearing WBAT  Pain Assessment  Pain Assessment 0-10  Pain Score 6  Pain Location left knee  Pain Descriptors / Indicators Aching;Guarding;Grimacing;Moaning  Pain Intervention(s) Limited activity within patient's tolerance;Monitored during session;Patient requesting pain meds-RN notified;Ice applied  Cognition  Arousal/Alertness Awake/alert  Behavior During Therapy WFL for tasks assessed/performed  Overall Cognitive Status Within Functional Limits for tasks assessed  Bed Mobility  General bed mobility comments Pt OOB in recliner when PT arrives  Transfers  Overall transfer level Needs assistance  Equipment used Rolling walker (2 wheeled)  Transfers Sit to/from Stand  Sit to Stand Min guard  General transfer comment Min guard to and from recliner.   Ambulation/Gait  Ambulation/Gait assistance Min guard  Ambulation Distance (Feet) 40 Feet  Assistive device  Rolling walker (2 wheeled)  Gait Pattern/deviations Step-to pattern;Decreased step length - right;Decreased step length - left;Decreased stance time - right;Decreased stance time - left;Antalgic  General Gait Details Moderate antalgic gait with cues for sequencing.   Gait velocity decreased  Gait velocity interpretation Below normal speed for age/gender  Stairs Yes  Stairs assistance Min guard  Stair Management No rails;Forwards;With walker;Backwards;Step to pattern  Number of Stairs 1  General stair comments Reviewed stepping up with RW into house on 1 step and reviewed posteriorly. Pt is able to perform with min guard for safety  Exercises  Exercises Total Joint  Total Joint Exercises  Ankle Circles/Pumps AROM;Both;20 reps;Supine  Quad Sets AROM;Both;10 reps;Supine  Heel Slides AAROM;Left;10 reps;Supine  PT - End of Session  Equipment Utilized During Treatment Gait belt;Left knee immobilizer  Activity Tolerance Patient tolerated treatment well;Patient limited by pain  Patient left in chair;with call bell/phone within reach;with family/visitor present  Nurse Communication Mobility status;Patient requests pain meds  PT - Assessment/Plan  PT Plan Current plan remains appropriate  PT Frequency (ACUTE ONLY) 7X/week  Follow Up Recommendations Home health PT  PT equipment Rolling walker with 5" wheels;3in1 (PT)  PT Goal Progression  Progress towards PT goals Progressing toward goals  PT Time Calculation  PT Start Time (ACUTE ONLY) 1148  PT Stop Time (ACUTE ONLY) 1226  PT Time Calculation (min) (ACUTE ONLY) 38 min  PT General Charges  $$ ACUTE PT VISIT 1 Procedure  PT Treatments  $Gait Training 23-37 mins  $Therapeutic Activity 8-22 mins   Scheryl Marten PT, DPT  [phone]

## 2016-03-27 NOTE — Discharge Summary (Signed)
Physician Discharge Summary  Patient ID: Thomas Clark MRN: [id number] DOB/AGE: 08/30/1960 56 y.o.  Admit date: 03/26/2016 Discharge date: 03/27/2016  Admission Diagnoses:  Primary localized osteoarthritis of left knee  Discharge Diagnoses:  Principal Problem:   Primary localized osteoarthritis of left knee Active Problems:   Severe obesity (BMI >= 40) (HCC)   S/P left unicompartmental knee replacement   Past Medical History:  Diagnosis Date  . Ankle fracture 1974   "put a cast on it"  . Arthritis    "feet, legs" (03/26/2016)  . High cholesterol   . Hypertension   . Obesity   . Primary localized osteoarthritis of left knee 03/26/2016  . Severe obesity (BMI >= 40) ([facility]) 03/26/2016    Surgeries: Procedure(s): UNICOMPARTMENTAL KNEE ARTHROPLASTY on 03/26/2016   Consultants (if any):   Discharged Condition: Improved  Hospital Course: Tipton Glazener is an 56 y.o. male who was admitted 03/26/2016 with a diagnosis of Primary localized osteoarthritis of left knee and went to the operating room on 03/26/2016 and underwent the above named procedures.    He was given perioperative antibiotics:  Anti-infectives    Start     Dose/Rate Route Frequency Ordered Stop   03/26/16 2200  ceFAZolin (ANCEF) IVPB 2g/100 mL premix     2 g 200 mL/hr over 30 Minutes Intravenous Every 6 hours 03/26/16 1720 03/27/16 0450   03/16/16 1015  ceFAZolin (ANCEF) 3 g in dextrose 5 % 50 mL IVPB     3 g 130 mL/hr over 30 Minutes Intravenous To Short Stay 03/15/16 0817 03/17/16 1015    .  He was given sequential compression devices, early ambulation, and xarelto for DVT prophylaxis.  He benefited maximally from the hospital stay and there were no complications.    Recent vital signs:  Vitals:   03/27/16 0352 03/27/16 0919  BP: (!) 150/93 (!) 187/94  Pulse: 98   Resp: 17   Temp: 99.1 F (37.3 C)     Recent laboratory studies:  Lab Results  Component Value Date   HGB 12.4 (L) 03/27/2016   HGB  14.0 03/26/2016   HGB 13.6 03/05/2016   Lab Results  Component Value Date   WBC 9.7 03/27/2016   PLT 242 03/27/2016   No results found for: INR Lab Results  Component Value Date   NA 136 03/27/2016   K 3.7 03/27/2016   CL 100 (L) 03/27/2016   CO2 27 03/27/2016   BUN 13 03/27/2016   CREATININE 0.87 03/27/2016   GLUCOSE 122 (H) 03/27/2016    Discharge Medications:   Allergies as of 03/27/2016      Reactions   Aleve [naproxen Sodium] Other (See Comments)   GI bleeds      Medication List    TAKE these medications   celecoxib 200 MG capsule Commonly known as:  CELEBREX Take 200 mg by mouth daily.   cetirizine 10 MG tablet Commonly known as:  ZYRTEC Take 10 mg by mouth daily.   [facility] EASY FIBER/CALCIUM Chew Chew 2 tablets by mouth 2 (two) times daily.   gabapentin 300 MG capsule Commonly known as:  NEURONTIN Take 300 mg by mouth 2 (two) times daily.   lisinopril-hydrochlorothiazide 20-12.5 MG tablet Commonly known as:  PRINZIDE,ZESTORETIC Take 1 tablet by mouth 2 (two) times daily.   methocarbamol 500 MG tablet Commonly known as:  ROBAXIN Take 1 tablet (500 mg total) by mouth every 8 (eight) hours as needed for muscle spasms.   multivitamin with minerals Tabs tablet Take 1  tablet by mouth 2 (two) times daily.   ondansetron 4 MG tablet Commonly known as:  ZOFRAN Take 1 tablet (4 mg total) by mouth every 8 (eight) hours as needed for nausea or vomiting.   oxyCODONE-acetaminophen 10-325 MG tablet Commonly known as:  PERCOCET Take 1-2 tablets by mouth every 6 (six) hours as needed for pain. MAXIMUM TOTAL ACETAMINOPHEN DOSE IS 4000 MG PER DAY   rivaroxaban 10 MG Tabs tablet Commonly known as:  XARELTO Take 1 tablet (10 mg total) by mouth daily.   sennosides-docusate sodium 8.6-50 MG tablet Commonly known as:  SENOKOT-S Take 2 tablets by mouth daily.   simvastatin 40 MG tablet Commonly known as:  ZOCOR Take 40 mg by mouth daily at 6 PM.   traMADol 50 MG  tablet Commonly known as:  ULTRAM Take 50 mg by mouth 3 (three) times daily as needed for moderate pain.            Durable Medical Equipment        Start     Ordered   03/26/16 2059  DME Walker rolling  Once    Question:  Patient needs a walker to treat with the following condition  Answer:  S/P left unicompartmental knee replacement   03/26/16 2058   03/26/16 2059  DME 3 n 1  Once     03/26/16 2058   03/26/16 2059  DME Bedside commode  Once    Question:  Patient needs a bedside commode to treat with the following condition  Answer:  S/P left unicompartmental knee replacement   03/26/16 2058      Diagnostic Studies: Dg Knee Left Port  Result Date: 03/26/2016 CLINICAL DATA:  Status post unicompartmental left knee replacement. Initial encounter. EXAM: PORTABLE LEFT KNEE - 1-2 VIEW COMPARISON:  None. FINDINGS: The patient is status post placement of a medial compartment arthroplasty at the left knee, without evidence of loosening or new fracture. Air and fluid are noted at the joint space. There is mild adjacent cortical irregularity along the central aspect of the articular surface of the medial femoral condyle. A fabella is noted. IMPRESSION: Status post medial compartment arthroplasty of the left knee, without evidence of loosening or new fracture. Mild adjacent cortical irregularity along the central aspect of the articular surface of the medial femoral condyle. Electronically Signed   By: Garald Balding M.D.   On: 03/26/2016 18:28    Disposition: Final discharge disposition not confirmed    Follow-up Information    Juliahna Wiswell P, MD. Schedule an appointment as soon as possible for a visit in 2 weeks.   Specialty:  Orthopedic Surgery Contact information: [place] [address] [phone]            Signed: [name] 03/27/2016, 1:25 PM

## 2016-03-27 NOTE — Evaluation (Signed)
Physical Therapy Evaluation Patient Details Name: Thomas Clark MRN: [id number] DOB: 12-Feb-1961 Today's Date: 03/27/2016   History of Present Illness  Pt is a 56 yo male admitted on 03/26/16 for left unicompartmental knee arthroplasty. 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 PMH significant for ankle fx, OA, HLD, HTN, obesity.   Clinical Impression  Pt is POD 1 and moving fairly well with therapy. Pt is limited by pain. Prior to admission, pt was using a walking stick for mobility due to pain in bilateral knees and was working full time as maintenance man. Pt lives with his wife in a two level town home but is able to live on the main level. During this assessment, pt required Min A to Min guard assist for all mobility with KI in place for gait and comfort. Pt will benefit from continuing to be seen acutely in order to address the below deficits in order to assist with a smooth transition home. Pt will benefit from HHPT upon discharge to maximize his functional outcomes.     Follow Up Recommendations Home health PT    Equipment Recommendations  Rolling walker with 5" wheels;3in1 (PT)    Recommendations for Other Services       Precautions / Restrictions Precautions Precautions: Knee Precaution Booklet Issued: Yes (comment) Precaution Comments: exercises handout given and reviewed Required Braces or Orthoses: Knee Immobilizer - Left Knee Immobilizer - Left: Other (comment);On when out of bed or walking (no orders in chart? ) Restrictions Weight Bearing Restrictions: Yes LLE Weight Bearing: Weight bearing as tolerated      Mobility  Bed Mobility Overal bed mobility: Needs Assistance Bed Mobility: Supine to Sit     Supine to sit: Mod assist;HOB elevated     General bed mobility comments: Mod A to bring LLE EOB, HOB elevated to allow pt to bring trunk upright.   Transfers Overall transfer level: Needs assistance Equipment used: Rolling walker (2 wheeled) Transfers:  Sit to/from Stand Sit to Stand: Min assist;From elevated surface         General transfer comment: Min A for safety from elevated surface with cues for safe hand placement.   Ambulation/Gait Ambulation/Gait assistance: Min guard Ambulation Distance (Feet): 20 Feet Assistive device: Rolling walker (2 wheeled) Gait Pattern/deviations: Step-to pattern;Decreased step length - right;Decreased step length - left;Decreased stance time - right;Decreased stance time - left;Antalgic Gait velocity: decreased Gait velocity interpretation: Below normal speed for age/gender General Gait Details: Moderate antalgic gait with cues for sequencing.   Stairs            Wheelchair Mobility    Modified Rankin (Stroke Patients Only)       Balance Overall balance assessment: Needs assistance Sitting-balance support: No upper extremity supported;Feet supported Sitting balance-Leahy Scale: Good Sitting balance - Comments: able to sit EOB fro 5 minutes without back support   Standing balance support: Bilateral upper extremity supported Standing balance-Leahy Scale: Poor Standing balance comment: relies on RW for stability                             Pertinent Vitals/Pain Pain Assessment: 0-10 Pain Score: 7  Pain Location: left knee Pain Descriptors / Indicators: Aching;Guarding;Grimacing;Moaning Pain Intervention(s): Limited activity within patient's tolerance;Monitored during session;Patient requesting pain meds-RN notified;Ice applied    Home Living Family/patient expects to be discharged to:: Private residence Living Arrangements: Spouse/significant other Available Help at Discharge: Family;Available 24 hours/day Type of Home: House (  townhouse) Home Access: Stairs to enter Entrance Stairs-Rails: None Entrance Stairs-Number of Steps: 1 Home Layout: Able to live on main level with bedroom/bathroom;Two level Home Equipment: Cane - single point;Hand held shower head;Shower  seat;Adaptive equipment (three wheeled walker)      Prior Function Level of Independence: Independent with assistive device(s)         Comments: was using AD due to pain, but 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      Hand Dominance   Dominant Hand: Right    Extremity/Trunk Assessment   Upper Extremity Assessment Upper Extremity Assessment: Defer to OT evaluation    Lower Extremity Assessment Lower Extremity Assessment: LLE deficits/detail;RLE deficits/detail RLE Deficits / Details: pt has increased right knee pain due to severe OA. Grossly 4/5.  LLE Deficits / Details: pt with normal post op pain and weakness. At least 3/5 ankle and 2/5 knee and hip per gross functional assessment.        Communication   Communication: No difficulties  Cognition Arousal/Alertness: Awake/alert Behavior During Therapy: WFL for tasks assessed/performed Overall Cognitive Status: Within Functional Limits for tasks assessed                      General Comments      Exercises Total Joint Exercises Ankle Circles/Pumps: AROM;Both;20 reps;Supine Quad Sets: AROM;Both;10 reps;Supine   Assessment/Plan    PT Assessment Patient needs continued PT services  PT Problem List Decreased strength;Decreased range of motion;Decreased activity tolerance;Decreased balance;Decreased mobility;Decreased knowledge of use of DME;Pain          PT Treatment Interventions DME instruction;Gait training;Stair training;Functional mobility training;Therapeutic activities;Therapeutic exercise;Balance training;Patient/family education    PT Goals (Current goals can be found in the Care Plan section)  Acute Rehab PT Goals Patient Stated Goal: to get back to work PT Goal Formulation: With patient Time For Goal Achievement: 04/03/16 Potential to Achieve Goals: Good    Frequency 7X/week   Barriers to discharge        Co-evaluation               End of Session Equipment  Utilized During Treatment: Gait belt;Left knee immobilizer Activity Tolerance: Patient tolerated treatment well;Patient limited by pain Patient left: in chair;with call bell/phone within reach;with family/visitor present Nurse Communication: Mobility status;Patient requests pain meds    Functional Assessment Tool Used: clinical judegement, mobility assessment Functional Limitation: Mobility: Walking and moving around Mobility: Walking and Moving Around Current Status [phone]): At least 20 percent but less than 40 percent impaired, limited or restricted Mobility: Walking and Moving Around Goal Status [phone]): At least 1 percent but less than 20 percent impaired, limited or restricted    Time: 0826-0908 PT Time Calculation (min) (ACUTE ONLY): 42 min   Charges:   PT Evaluation $PT Eval Moderate Complexity: 1 Procedure PT Treatments $Gait Training: 8-22 mins $Therapeutic Activity: 8-22 mins   PT G Codes:   PT G-Codes **NOT FOR INPATIENT CLASS** Functional Assessment Tool Used: clinical judegement, mobility assessment Functional Limitation: Mobility: Walking and moving around Mobility: Walking and Moving Around Current Status [phone]): At least 20 percent but less than 40 percent impaired, limited or restricted Mobility: Walking and Moving Around Goal Status [phone]): At least 1 percent but less than 20 percent impaired, limited or restricted    Scheryl Marten PT, DPT  [phone]  03/27/2016, 11:22 AM

## 2016-03-27 NOTE — Progress Notes (Signed)
Patient discharged to home with wife and all discharge instructions about 1530. Dressing and ace wrap clean dry and intact to LLE. Patient given instructions to change dressing 3-5 days. Supplies given .

## 2016-03-27 NOTE — Discharge Instructions (Signed)
INSTRUCTIONS AFTER JOINT REPLACEMENT  ° °o Remove items at home which could result in a fall. This includes throw rugs or furniture in walking pathways °o ICE to the affected joint every three hours while awake for 30 minutes at a time, for at least the first 3-5 days, and then as needed for pain and swelling.  Continue to use ice for pain and swelling. You may notice swelling that will progress down to the foot and ankle.  This is normal after surgery.  Elevate your leg when you are not up walking on it.   °o Continue to use the breathing machine you got in the hospital (incentive spirometer) which will help keep your temperature down.  It is common for your temperature to cycle up and down following surgery, especially at night when you are not up moving around and exerting yourself.  The breathing machine keeps your lungs expanded and your temperature down. ° ° °DIET:  As you were doing prior to hospitalization, we recommend a well-balanced diet. ° °DRESSING / WOUND CARE / SHOWERING ° °You may change your dressing 3-5 days after surgery.  Then change the dressing every day with sterile gauze.  Please use good hand washing techniques before changing the dressing.  Do not use any lotions or creams on the incision until instructed by your surgeon. ° °ACTIVITY ° °o Increase activity slowly as tolerated, but follow the weight bearing instructions below.   °o No driving for 6 weeks or until further direction given by your physician.  You cannot drive while taking narcotics.  °o No lifting or carrying greater than 10 lbs. until further directed by your surgeon. °o Avoid periods of inactivity such as sitting longer than an hour when not asleep. This helps prevent blood clots.  °o You may return to work once you are authorized by your doctor.  ° ° ° °WEIGHT BEARING  ° °Weight bearing as tolerated with assist device (walker, cane, etc) as directed, use it as long as suggested by your surgeon or therapist, typically at  least 4-6 weeks. ° ° °EXERCISES ° °Results after joint replacement surgery are often greatly improved when you follow the exercise, range of motion and muscle strengthening exercises prescribed by your doctor. Safety measures are also important to protect the joint from further injury. Any time any of these exercises cause you to have increased pain or swelling, decrease what you are doing until you are comfortable again and then slowly increase them. If you have problems or questions, call your caregiver or physical therapist for advice.  ° °Rehabilitation is important following a joint replacement. After just a few days of immobilization, the muscles of the leg can become weakened and shrink (atrophy).  These exercises are designed to build up the tone and strength of the thigh and leg muscles and to improve motion. Often times heat used for twenty to thirty minutes before working out will loosen up your tissues and help with improving the range of motion but do not use heat for the first two weeks following surgery (sometimes heat can increase post-operative swelling).  ° °These exercises can be done on a training (exercise) mat, on the floor, on a table or on a bed. Use whatever works the best and is most comfortable for you.    Use music or television while you are exercising so that the exercises are a pleasant break in your day. This will make your life better with the exercises acting as a break   in your routine that you can look forward to.   Perform all exercises about fifteen times, three times per day or as directed.  You should exercise both the operative leg and the other leg as well. ° °Exercises include: °  °• Quad Sets - Tighten up the muscle on the front of the thigh (Quad) and hold for 5-10 seconds.   °• Straight Leg Raises - With your knee straight (if you were given a brace, keep it on), lift the leg to 60 degrees, hold for 3 seconds, and slowly lower the leg.  Perform this exercise against  resistance later as your leg gets stronger.  °• Leg Slides: Lying on your back, slowly slide your foot toward your buttocks, bending your knee up off the floor (only go as far as is comfortable). Then slowly slide your foot back down until your leg is flat on the floor again.  °• Angel Wings: Lying on your back spread your legs to the side as far apart as you can without causing discomfort.  °• Hamstring Strength:  Lying on your back, push your heel against the floor with your leg straight by tightening up the muscles of your buttocks.  Repeat, but this time bend your knee to a comfortable angle, and push your heel against the floor.  You may put a pillow under the heel to make it more comfortable if necessary.  ° °A rehabilitation program following joint replacement surgery can speed recovery and prevent re-injury in the future due to weakened muscles. Contact your doctor or a physical therapist for more information on knee rehabilitation.  ° ° °CONSTIPATION ° °Constipation is defined medically as fewer than three stools per week and severe constipation as less than one stool per week.  Even if you have a regular bowel pattern at home, your normal regimen is likely to be disrupted due to multiple reasons following surgery.  Combination of anesthesia, postoperative narcotics, change in appetite and fluid intake all can affect your bowels.  ° °YOU MUST use at least one of the following options; they are listed in order of increasing strength to get the job done.  They are all available over the counter, and you may need to use some, POSSIBLY even all of these options:   ° °Drink plenty of fluids (prune juice may be helpful) and high fiber foods °Colace 100 mg by mouth twice a day  °Senokot for constipation as directed and as needed Dulcolax (bisacodyl), take with full glass of water  °Miralax (polyethylene glycol) once or twice a day as needed. ° °If you have tried all these things and are unable to have a bowel  movement in the first 3-4 days after surgery call either your surgeon or your primary doctor.   ° °If you experience loose stools or diarrhea, hold the medications until you stool forms back up.  If your symptoms do not get better within 1 week or if they get worse, check with your doctor.  If you experience "the worst abdominal pain ever" or develop nausea or vomiting, please contact the office immediately for further recommendations for treatment. ° ° °ITCHING:  If you experience itching with your medications, try taking only a single pain pill, or even half a pain pill at a time.  You can also use Benadryl over the counter for itching or also to help with sleep.  ° °TED HOSE STOCKINGS:  Use stockings on both legs until for at least 2 weeks or as   directed by physician office. They may be removed at night for sleeping. ° °MEDICATIONS:  See your medication summary on the “After Visit Summary” that nursing will review with you.  You may have some home medications which will be placed on hold until you complete the course of blood thinner medication.  It is important for you to complete the blood thinner medication as prescribed. ° °PRECAUTIONS:  If you experience chest pain or shortness of breath - call 911 immediately for transfer to the hospital emergency department.  ° °If you develop a fever greater that 101 F, purulent drainage from wound, increased redness or drainage from wound, foul odor from the wound/dressing, or calf pain - CONTACT YOUR SURGEON.   °                                                °FOLLOW-UP APPOINTMENTS:  If you do not already have a post-op appointment, please call the office for an appointment to be seen by your surgeon.  Guidelines for how soon to be seen are listed in your “After Visit Summary”, but are typically between 1-4 weeks after surgery. ° °OTHER INSTRUCTIONS:  ° °Knee Replacement:  Do not place pillow under knee, focus on keeping the knee straight while resting. CPM  instructions: 0-90 degrees, 2 hours in the morning, 2 hours in the afternoon, and 2 hours in the evening. Place foam block, curve side up under heel at all times except when in CPM or when walking.  DO NOT modify, tear, cut, or change the foam block in any way. ° °MAKE SURE YOU:  °• Understand these instructions.  °• Get help right away if you are not doing well or get worse.  ° ° °Thank you for letting us be a part of your medical care team.  It is a privilege we respect greatly.  We hope these instructions will help you stay on track for a fast and full recovery!  ° ° ° °Information on my medicine - XARELTO® (Rivaroxaban) ° °This medication education was reviewed with me or my healthcare representative as part of my discharge preparation.  The pharmacist that spoke with me during my hospital stay was:  Shequita Peplinski Dien, RPH ° °Why was Xarelto® prescribed for you? °Xarelto® was prescribed for you to reduce the risk of blood clots forming after orthopedic surgery. The medical term for these abnormal blood clots is venous thromboembolism (VTE). ° °What do you need to know about xarelto® ? °Take your Xarelto® ONCE DAILY at the same time every day. °You may take it either with or without food. ° °If you have difficulty swallowing the tablet whole, you may crush it and mix in applesauce just prior to taking your dose. ° °Take Xarelto® exactly as prescribed by your doctor and DO NOT stop taking Xarelto® without talking to the doctor who prescribed the medication.  Stopping without other VTE prevention medication to take the place of Xarelto® may increase your risk of developing a clot. ° °After discharge, you should have regular check-up appointments with your healthcare provider that is prescribing your Xarelto®.   ° °What do you do if you miss a dose? °If you miss a dose, take it as soon as you remember on the same day then continue your regularly scheduled once daily regimen the next day. Do not take two   doses of  Xarelto® on the same day.  ° °Important Safety Information °A possible side effect of Xarelto® is bleeding. You should call your healthcare provider right away if you experience any of the following: °? Bleeding from an injury or your nose that does not stop. °? Unusual colored urine (red or dark brown) or unusual colored stools (red or black). °? Unusual bruising for unknown reasons. °? A serious fall or if you hit your head (even if there is no bleeding). ° °Some medicines may interact with Xarelto® and might increase your risk of bleeding while on Xarelto®. To help avoid this, consult your healthcare provider or pharmacist prior to using any new prescription or non-prescription medications, including herbals, vitamins, non-steroidal anti-inflammatory drugs (NSAIDs) and supplements. ° °This website has more information on Xarelto®: www.xarelto.com. ° ° ° ° °

## 2016-03-27 NOTE — Plan of Care (Signed)
Problem: Education: Goal: Knowledge of [facility] Education information/materials will improve Outcome: Progressing POC reviewed with pt./wife.   

## 2016-03-27 NOTE — Evaluation (Signed)
Occupational Therapy Evaluation and Discharge Patient Details Name: Thomas Clark MRN: [id number] DOB: 08/18/1960 Today's Date: 03/27/2016    History of Present Illness Pt is a 56 yo male admitted on 03/26/16 for left unicompartmental knee arthroplasty. 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 PMH significant for ankle fx, OA, HLD, HTN, obesity.    Clinical Impression   PTA Pt independent in ADL and mobility. Pt currently max A for ADL and min guard for mobility with RW. Pt provided handout for tub transfer with 3 in 1. Pt will require bariatric DME. Pt and wife received all assessment and education. Pt adequate for dc to venue below from OT perspective. OT to sign off at this time. Thank you for the referral.    Follow Up Recommendations  No OT follow up;Supervision/Assistance - 24 hour (initially)    Equipment Recommendations  3 in 1 bedside commode;Other (comment) (BARIATRIC necessary, RW)    Recommendations for Other Services       Precautions / Restrictions Precautions Precautions: Knee Precaution Booklet Issued: Yes (comment) Precaution Comments: reviewed no pillow under knee Required Braces or Orthoses: Knee Immobilizer - Left Knee Immobilizer - Left: Other (comment);On when out of bed or walking Restrictions Weight Bearing Restrictions: Yes LLE Weight Bearing: Weight bearing as tolerated      Mobility Bed Mobility Overal bed mobility: Needs Assistance Bed Mobility: Supine to Sit     Supine to sit: Mod assist;HOB elevated     General bed mobility comments: Pt OOB in recliner when OT entered room  Transfers Overall transfer level: Needs assistance Equipment used: Rolling walker (2 wheeled) Transfers: Sit to/from Stand Sit to Stand: Min assist         General transfer comment: min A for safety and axiety    Balance Overall balance assessment: Needs assistance Sitting-balance support: No upper extremity supported;Feet supported Sitting  balance-Leahy Scale: Good Sitting balance - Comments: able to sit EOB fro 5 minutes without back support   Standing balance support: Bilateral upper extremity supported;Single extremity supported Standing balance-Leahy Scale: Poor Standing balance comment: relies on RW for stability                            ADL Overall ADL's : Needs assistance/impaired Eating/Feeding: Set up;Sitting   Grooming: Set up;Sitting Grooming Details (indicate cue type and reason): declined sink level, educated on cup method for oral care Upper Body Bathing: Moderate assistance;With caregiver independent assisting   Lower Body Bathing: Maximal assistance;With caregiver independent assisting   Upper Body Dressing : Set up;Sitting   Lower Body Dressing: Maximal assistance;Sit to/from stand;With caregiver independent assisting   Toilet Transfer: Min guard;Comfort height toilet;Ambulation (standing for urination) Toilet Transfer Details (indicate cue type and reason): ambulated into bathroom Toileting- Clothing Manipulation and Hygiene: Min guard (standing)       Functional mobility during ADLs: Minimal assistance;Rolling walker General ADL Comments: Pt's wife willing and able to assist with LB ADL     Vision Vision Assessment?: No apparent visual deficits   Perception     Praxis      Pertinent Vitals/Pain Pain Assessment: 0-10 Pain Score: 5  Pain Location: left knee Pain Descriptors / Indicators: Aching;Guarding;Grimacing;Moaning Pain Intervention(s): Limited activity within patient's tolerance;Premedicated before session;Repositioned;Ice applied     Hand Dominance Right   Extremity/Trunk Assessment Upper Extremity Assessment Upper Extremity Assessment: Overall WFL for tasks assessed   Lower Extremity Assessment Lower Extremity Assessment: LLE deficits/detail;Defer  to PT evaluation RLE Deficits / Details: pt has increased right knee pain due to severe OA. Grossly 4/5.  LLE  Deficits / Details: pt with normal post op pain and weakness.        Communication Communication Communication: No difficulties   Cognition Arousal/Alertness: Awake/alert Behavior During Therapy: WFL for tasks assessed/performed Overall Cognitive Status: Within Functional Limits for tasks assessed                     General Comments       Exercises      Shoulder Instructions      Home Living Family/patient expects to be discharged to:: Private residence Living Arrangements: Spouse/significant other Available Help at Discharge: Family;Available 24 hours/day Type of Home: House (townhome) Home Access: Stairs to enter [employer] of Steps: 1 Entrance Stairs-Rails: None Home Layout: Able to live on main level with bedroom/bathroom;Two level     Bathroom Shower/Tub: Tub/shower unit;Curtain Shower/tub characteristics: Architectural technologist: Standard Bathroom Accessibility: Yes   Home Equipment: Cane - single point;Hand held shower head;Shower seat;Adaptive equipment (3 wheeled walker) Adaptive Equipment: Reacher        Prior Functioning/Environment Level of Independence: Independent with assistive device(s)        Comments: was using AD due to pain, but 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         OT Problem List: Pain;Decreased range of motion;Decreased activity tolerance;Impaired balance (sitting and/or standing);Decreased safety awareness;Decreased knowledge of use of DME or AE;Obesity   OT Treatment/Interventions:      OT Goals(Current goals can be found in the care plan section) Acute Rehab OT Goals Patient Stated Goal: to get back to work OT Goal Formulation: With patient Time For Goal Achievement: 04/03/16 Potential to Achieve Goals: Good  OT Frequency:     Barriers to D/C:            Co-evaluation              End of Session Equipment Utilized During Treatment: Gait belt;Rolling walker;Left knee  immobilizer Nurse Communication: Mobility status;Weight bearing status;Other (comment) (MD in room, therapy complete)  Activity Tolerance: Patient tolerated treatment well Patient left: in chair;with call bell/phone within reach;with family/visitor present   Time: [id number] OT Time Calculation (min): 30 min Charges:  OT General Charges $OT Visit: 1 Procedure OT Evaluation $OT Eval Moderate Complexity: 1 Procedure OT Treatments $Self Care/Home Management : 8-22 mins G-Codes: OT G-codes **NOT FOR INPATIENT CLASS** Functional Assessment Tool Used: Clinical Judgement Functional Limitation: Self care Self Care Current Status [id number]): At least 40 percent but less than 60 percent impaired, limited or restricted Self Care Goal Status [id number]): At least 1 percent but less than 20 percent impaired, limited or restricted Self Care Discharge Status [phone]): At least 40 percent but less than 60 percent impaired, limited or restricted  Jaci Carrel 03/27/2016, 1:27 PM  Hulda Humphrey OTR/L [phone]

## 2016-03-29 ENCOUNTER — Encounter (HOSPITAL_COMMUNITY): Payer: Self-pay | Admitting: Orthopedic Surgery

## 2016-03-29 NOTE — Anesthesia Postprocedure Evaluation (Addendum)
Anesthesia Post Note  Patient: Thomas [place]  Procedure(s) Performed: Procedure(s) (LRB): UNICOMPARTMENTAL KNEE ARTHROPLASTY (Left)  Patient location during evaluation: PACU Anesthesia Type: Spinal Level of consciousness: awake, awake and alert and oriented Pain management: pain level controlled Vital Signs Assessment: post-procedure vital signs reviewed and stable Respiratory status: spontaneous breathing, nonlabored ventilation and respiratory function stable Cardiovascular status: blood pressure returned to baseline Postop Assessment: spinal receding Anesthetic complications: no       Last Vitals:  Vitals:   03/27/16 0352 03/27/16 0919  BP: (!) 150/93 (!) 187/94  Pulse: 98   Resp: 17   Temp: 37.3 C     Last Pain:  Vitals:   03/27/16 1253  TempSrc:   PainSc: 6                  [name]

## 2016-10-21 NOTE — Pre-Procedure Instructions (Addendum)
Thomas [place]  10/21/2016      [id number] [name], [place] [address] Phone: [phone] Fax: [phone]    Your procedure is scheduled on Tues. Sept. 4  Report to [facility] at 9:15 A.M.  Call this number if you have problems the morning of surgery:  [phone]   Remember:  Do not eat food or drink liquids after midnight on Mon. Sept. 3   Take these medicines the morning of surgery with A SIP OF WATER : zyrtec, robaxin if needed, oxycodone if needed,                1 week prior to surgery stop: aspirin, advil, motrin, ibuprofen, aleve, celebrex, Goody's, BC Powders, vitamins and herbal medicines.   Do not wear jewelry.  Do not wear lotions, powders, or perfumes, or deoderant.  Do not shave 48 hours prior to surgery.  Men may shave face and neck.  Do not bring valuables to the hospital.  [facility] is not responsible for any belongings or valuables.  Contacts, dentures or bridgework may not be worn into surgery.  Leave your suitcase in the car.  After surgery it may be brought to your room.  For patients admitted to the hospital, discharge time will be determined by your treatment team.  Patients discharged the day of surgery will not be allowed to drive home.    Special instructions:   [place]- Preparing For Surgery  Before surgery, you can play an important role. Because skin is not sterile, your skin needs to be as free of germs as possible. You can reduce the number of germs on your skin by washing with CHG (chlorahexidine gluconate) Soap before surgery.  CHG is an antiseptic cleaner which kills germs and bonds with the skin to continue killing germs even after washing.  Please do not use if you have an allergy to CHG or antibacterial soaps. If your skin becomes reddened/irritated stop using the CHG.  Do not shave (including legs and underarms) for at least 48 hours prior to  first CHG shower. It is OK to shave your face.  Please follow these instructions carefully.   1. Shower the NIGHT BEFORE SURGERY and the MORNING OF SURGERY with CHG.   2. If you chose to wash your hair, wash your hair first as usual with your normal shampoo.  3. After you shampoo, rinse your hair and body thoroughly to remove the shampoo.  4. Use CHG as you would any other liquid soap. You can apply CHG directly to the skin and wash gently with a scrungie or a clean washcloth.   5. Apply the CHG Soap to your body ONLY FROM THE NECK DOWN.  Do not use on open wounds or open sores. Avoid contact with your eyes, ears, mouth and genitals (private parts). Wash genitals (private parts) with your normal soap.  6. Wash thoroughly, paying special attention to the area where your surgery will be performed.  7. Thoroughly rinse your body with warm water from the neck down.  8. DO NOT shower/wash with your normal soap after using and rinsing off the CHG Soap.  9. Pat yourself dry with a CLEAN TOWEL.   10. Wear CLEAN PAJAMAS   11. Place CLEAN SHEETS on your bed the night of your first shower and DO NOT SLEEP WITH PETS.    Day of Surgery: Do not apply any deodorants/lotions. Please wear clean  clothes to the hospital/surgery center.      Please read over the following fact sheets that you were given. Coughing and Deep Breathing, Total Joint Packet, MRSA Information and Surgical Site Infection Prevention

## 2016-10-21 NOTE — Addendum Note (Signed)
Addendum  created 10/21/16 1021 by Roberts Gaudy, MD   Sign clinical note

## 2016-10-22 ENCOUNTER — Encounter (HOSPITAL_COMMUNITY)
Admission: RE | Admit: 2016-10-22 | Discharge: 2016-10-22 | Disposition: A | Discharge status: Home / Self Care | Payer: 59 | Source: Ambulatory Visit | Attending: Orthopedic Surgery | Admitting: Orthopedic Surgery

## 2016-10-22 ENCOUNTER — Encounter (HOSPITAL_COMMUNITY): Payer: Self-pay

## 2016-10-22 DIAGNOSIS — M1711 Unilateral primary osteoarthritis, right knee: Secondary | ICD-10-CM | POA: Insufficient documentation

## 2016-10-22 DIAGNOSIS — Z01812 Encounter for preprocedural laboratory examination: Secondary | ICD-10-CM | POA: Insufficient documentation

## 2016-10-22 HISTORY — DX: Umbilical hernia without obstruction or gangrene: K42.9

## 2016-10-22 LAB — BASIC METABOLIC PANEL
Anion gap: 10 (ref 5–15)
BUN: 12 mg/dL (ref 6–20)
CHLORIDE: 102 mmol/L (ref 101–111)
CO2: 25 mmol/L (ref 22–32)
CREATININE: 0.81 mg/dL (ref 0.61–1.24)
Calcium: 9.1 mg/dL (ref 8.9–10.3)
GFR calc non Af Amer: >60 mL/min (ref >60)
Glucose, Bld: 92 mg/dL (ref 65–99)
Potassium: 3.5 mmol/L (ref 3.5–5.1)
Sodium: 137 mmol/L (ref 135–145)

## 2016-10-22 LAB — CBC
HEMATOCRIT: 39.5 % (ref 39.0–52.0)
HEMOGLOBIN: 13.6 g/dL (ref 13.0–17.0)
MCH: 29.8 pg (ref 26.0–34.0)
MCHC: 34.4 g/dL (ref 30.0–36.0)
MCV: 86.4 fL (ref 78.0–100.0)
Platelets: 201 10*3/uL (ref 150–400)
RBC: 4.57 MIL/uL (ref 4.22–5.81)
RDW: 12.6 % (ref 11.5–15.5)
WBC: 5.8 10*3/uL (ref 4.0–10.5)

## 2016-10-22 LAB — SURGICAL PCR SCREEN
MRSA, PCR: NEGATIVE
STAPHYLOCOCCUS AUREUS: NEGATIVE

## 2016-10-22 NOTE — Pre-Procedure Instructions (Addendum)
[place]  10/22/2016      [id number] - [name], [place] [address] Phone: [phone] Fax: [phone]    Your procedure is scheduled on Tues. Sept. 4  Report to [facility] at 9:15 A.M.  Call this number if you have problems the morning of surgery:  [phone]   Remember:  Do not eat food or drink liquids after midnight on Mon. Sept. 3   Take these medicines the morning of surgery with A SIP OF WATER : tylenol if needed                1 week prior to surgery (10/26/16) stop: aspirin, advil, motrin, ibuprofen, aleve, celebrex, Goody's, BC Powders, vitamins and herbal medicines.   Do not wear jewelry.  Do not wear lotions, powders, or perfumes, or deoderant.  Do not shave 48 hours prior to surgery.  Men may shave face and neck.  Do not bring valuables to the hospital.  [facility] is not responsible for any belongings or valuables.  Contacts, dentures or bridgework may not be worn into surgery.  Leave your suitcase in the car.  After surgery it may be brought to your room.  For patients admitted to the hospital, discharge time will be determined by your treatment team.  Patients discharged the day of surgery will not be allowed to drive home.    Special instructions:   [place]- Preparing For Surgery  Before surgery, you can play an important role. Because skin is not sterile, your skin needs to be as free of germs as possible. You can reduce the number of germs on your skin by washing with CHG (chlorahexidine gluconate) Soap before surgery.  CHG is an antiseptic cleaner which kills germs and bonds with the skin to continue killing germs even after washing.  Please do not use if you have an allergy to CHG or antibacterial soaps. If your skin becomes reddened/irritated stop using the CHG.  Do not shave (including legs and underarms) for at least 48 hours prior to first CHG shower. It  is OK to shave your face.  Please follow these instructions carefully.   1. Shower the NIGHT BEFORE SURGERY and the MORNING OF SURGERY with CHG.   2. If you chose to wash your hair, wash your hair first as usual with your normal shampoo.  3. After you shampoo, rinse your hair and body thoroughly to remove the shampoo.  4. Use CHG as you would any other liquid soap. You can apply CHG directly to the skin and wash gently with a scrungie or a clean washcloth.   5. Apply the CHG Soap to your body ONLY FROM THE NECK DOWN.  Do not use on open wounds or open sores. Avoid contact with your eyes, ears, mouth and genitals (private parts). Wash genitals (private parts) with your normal soap.  6. Wash thoroughly, paying special attention to the area where your surgery will be performed.  7. Thoroughly rinse your body with warm water from the neck down.  8. DO NOT shower/wash with your normal soap after using and rinsing off the CHG Soap.  9. Pat yourself dry with a CLEAN TOWEL.   10. Wear CLEAN PAJAMAS   11. Place CLEAN SHEETS on your bed the night of your first shower and DO NOT SLEEP WITH PETS.    Day of Surgery: Do not apply any deodorants/lotions. Please wear clean clothes to the  hospital/surgery center.      Please read over the  fact sheets that you were given.

## 2016-10-22 NOTE — Progress Notes (Signed)
   10/22/16 1028  OBSTRUCTIVE SLEEP APNEA  Have you ever been diagnosed with sleep apnea through a sleep study? No  Do you snore loudly (loud enough to be heard through closed doors)?  0  Do you often feel tired, fatigued, or sleepy during the daytime (such as falling asleep during driving or talking to someone)? 0  Has anyone observed you stop breathing during your sleep? 0  Do you have, or are you being treated for high blood pressure? 1  BMI more than 35 kg/m2? 1  Age > 50 (1-yes) 1  Neck circumference greater than:Male 16 inches or larger, Male 17inches or larger? 1 (20.5)  Male Gender (Yes=1) 1  Obstructive Sleep Apnea Score 5  Score 5 or greater  Results sent to PCP

## 2016-10-31 MED ORDER — DEXTROSE 5 % IV SOLN
3.0000 g | INTRAVENOUS | Status: AC
  Administered 2016-11-02: 3 g via INTRAVENOUS
  Filled 2016-10-31: qty 3000

## 2016-11-02 ENCOUNTER — Observation Stay (HOSPITAL_COMMUNITY): Payer: 59

## 2016-11-02 ENCOUNTER — Inpatient Hospital Stay (HOSPITAL_COMMUNITY): Payer: 59 | Admitting: Certified Registered Nurse Anesthetist

## 2016-11-02 ENCOUNTER — Encounter (HOSPITAL_COMMUNITY)
Admission: RE | Disposition: A | Discharge status: Home / Self Care | Payer: Self-pay | Source: Ambulatory Visit | Attending: Orthopedic Surgery

## 2016-11-02 ENCOUNTER — Inpatient Hospital Stay (HOSPITAL_COMMUNITY): Payer: 59 | Admitting: Vascular Surgery

## 2016-11-02 ENCOUNTER — Encounter (HOSPITAL_COMMUNITY): Payer: Self-pay | Admitting: Certified Registered Nurse Anesthetist

## 2016-11-02 ENCOUNTER — Observation Stay (HOSPITAL_COMMUNITY)
Admission: RE | Admit: 2016-11-02 | Discharge: 2016-11-04 | Disposition: A | Discharge status: Home / Self Care | Payer: 59 | Source: Ambulatory Visit | Attending: Orthopedic Surgery | Admitting: Orthopedic Surgery

## 2016-11-02 DIAGNOSIS — F17290 Nicotine dependence, other tobacco product, uncomplicated: Secondary | ICD-10-CM | POA: Diagnosis not present

## 2016-11-02 DIAGNOSIS — Z6841 Body Mass Index (BMI) 40.0 and over, adult: Secondary | ICD-10-CM | POA: Diagnosis not present

## 2016-11-02 DIAGNOSIS — Z96659 Presence of unspecified artificial knee joint: Secondary | ICD-10-CM

## 2016-11-02 DIAGNOSIS — M1711 Unilateral primary osteoarthritis, right knee: Secondary | ICD-10-CM | POA: Diagnosis present

## 2016-11-02 DIAGNOSIS — I10 Essential (primary) hypertension: Secondary | ICD-10-CM | POA: Insufficient documentation

## 2016-11-02 DIAGNOSIS — Z79899 Other long term (current) drug therapy: Secondary | ICD-10-CM | POA: Diagnosis not present

## 2016-11-02 DIAGNOSIS — E7800 Pure hypercholesterolemia, unspecified: Secondary | ICD-10-CM | POA: Insufficient documentation

## 2016-11-02 DIAGNOSIS — Z96651 Presence of right artificial knee joint: Secondary | ICD-10-CM

## 2016-11-02 HISTORY — PX: PARTIAL KNEE ARTHROPLASTY: SHX2174

## 2016-11-02 HISTORY — DX: Unilateral primary osteoarthritis, right knee: M17.11

## 2016-11-02 SURGERY — ARTHROPLASTY, KNEE, UNICOMPARTMENTAL
Anesthesia: Spinal | Site: Knee | Laterality: Right

## 2016-11-02 MED ORDER — METHOCARBAMOL 500 MG PO TABS
ORAL_TABLET | ORAL | Status: AC
  Administered 2016-11-02: 500 mg via ORAL
  Filled 2016-11-02: qty 1

## 2016-11-02 MED ORDER — OXYCODONE HCL 5 MG PO TABS
ORAL_TABLET | ORAL | Status: AC
  Administered 2016-11-02: 10 mg via ORAL
  Filled 2016-11-02: qty 2

## 2016-11-02 MED ORDER — HYDROMORPHONE HCL 1 MG/ML IJ SOLN
INTRAMUSCULAR | Status: AC
  Administered 2016-11-02: 0.5 mg via INTRAVENOUS
  Filled 2016-11-02: qty 1

## 2016-11-02 MED ORDER — POLYETHYLENE GLYCOL 3350 17 G PO PACK
17.0000 g | PACK | Freq: Every day | ORAL | Status: DC | PRN
  Filled 2016-11-02: qty 1

## 2016-11-02 MED ORDER — ACETAMINOPHEN 650 MG RE SUPP: 650.0000 mg | Freq: Four times a day (QID) | RECTAL | Status: DC | PRN

## 2016-11-02 MED ORDER — HYDROMORPHONE HCL 1 MG/ML IJ SOLN
0.5000 mg | INTRAMUSCULAR | Status: DC | PRN
  Administered 2016-11-03: 0.5 mg via INTRAVENOUS
  Filled 2016-11-02: qty 1

## 2016-11-02 MED ORDER — BENEFIBER DRINK MIX PO PACK: 1.0000 | PACK | ORAL | Status: DC

## 2016-11-02 MED ORDER — ALUM & MAG HYDROXIDE-SIMETH 200-200-20 MG/5ML PO SUSP: 30.0000 mL | ORAL | Status: DC | PRN

## 2016-11-02 MED ORDER — ADULT MULTIVITAMIN W/MINERALS CH
1.0000 | ORAL_TABLET | Freq: Every day | ORAL | Status: DC
  Administered 2016-11-02 – 2016-11-04 (×3): 1 via ORAL
  Filled 2016-11-02 (×3): qty 1

## 2016-11-02 MED ORDER — PHENOL 1.4 % MT LIQD: 1.0000 | OROMUCOSAL | Status: DC | PRN

## 2016-11-02 MED ORDER — ONDANSETRON HCL 4 MG/2ML IJ SOLN
INTRAMUSCULAR | Status: DC | PRN
  Administered 2016-11-02: 4 mg via INTRAVENOUS

## 2016-11-02 MED ORDER — FENTANYL CITRATE (PF) 100 MCG/2ML IJ SOLN
INTRAMUSCULAR | Status: DC | PRN
  Administered 2016-11-02 (×2): 50 ug via INTRAVENOUS
  Administered 2016-11-02: 100 ug via INTRAVENOUS
  Administered 2016-11-02: 50 ug via INTRAVENOUS

## 2016-11-02 MED ORDER — LISINOPRIL-HYDROCHLOROTHIAZIDE 20-12.5 MG PO TABS: 1.0000 | ORAL_TABLET | Freq: Two times a day (BID) | ORAL | Status: DC

## 2016-11-02 MED ORDER — CVS EASY FIBER/CALCIUM PO CHEW: 2.0000 | CHEWABLE_TABLET | Freq: Two times a day (BID) | ORAL | Status: DC

## 2016-11-02 MED ORDER — MENTHOL 3 MG MT LOZG
1.0000 | LOZENGE | OROMUCOSAL | Status: DC | PRN
  Filled 2016-11-02: qty 9

## 2016-11-02 MED ORDER — DULOXETINE HCL 60 MG PO CPEP
60.0000 mg | ORAL_CAPSULE | Freq: Every day | ORAL | Status: DC
  Administered 2016-11-02 – 2016-11-03 (×2): 60 mg via ORAL
  Filled 2016-11-02 (×2): qty 1

## 2016-11-02 MED ORDER — OXYCODONE HCL 5 MG PO TABS
5.0000 mg | ORAL_TABLET | ORAL | Status: DC | PRN
  Administered 2016-11-02 – 2016-11-04 (×13): 10 mg via ORAL
  Filled 2016-11-02 (×12): qty 2

## 2016-11-02 MED ORDER — ONDANSETRON HCL 4 MG PO TABS: 4.0000 mg | ORAL_TABLET | Freq: Four times a day (QID) | ORAL | Status: DC | PRN

## 2016-11-02 MED ORDER — CELECOXIB 200 MG PO CAPS
ORAL_CAPSULE | ORAL | Status: AC
  Administered 2016-11-02: 200 mg
  Filled 2016-11-02: qty 1

## 2016-11-02 MED ORDER — FENTANYL CITRATE (PF) 100 MCG/2ML IJ SOLN
INTRAMUSCULAR | Status: AC
  Administered 2016-11-02: 50 ug
  Filled 2016-11-02: qty 2

## 2016-11-02 MED ORDER — PHENYLEPHRINE 40 MCG/ML (10ML) SYRINGE FOR IV PUSH (FOR BLOOD PRESSURE SUPPORT)
PREFILLED_SYRINGE | INTRAVENOUS | Status: DC | PRN
  Administered 2016-11-02 (×3): 80 ug via INTRAVENOUS

## 2016-11-02 MED ORDER — METHOCARBAMOL 500 MG PO TABS
500.0000 mg | ORAL_TABLET | Freq: Four times a day (QID) | ORAL | Status: DC | PRN
  Administered 2016-11-02 – 2016-11-04 (×8): 500 mg via ORAL
  Filled 2016-11-02 (×7): qty 1

## 2016-11-02 MED ORDER — ACETAMINOPHEN 500 MG PO TABS
ORAL_TABLET | ORAL | Status: AC
  Administered 2016-11-02: 1000 mg
  Filled 2016-11-02: qty 2

## 2016-11-02 MED ORDER — SENNA-DOCUSATE SODIUM 8.6-50 MG PO TABS: 2.0000 | ORAL_TABLET | Freq: Every day | ORAL | Status: DC

## 2016-11-02 MED ORDER — ONDANSETRON HCL 4 MG/2ML IJ SOLN: 4.0000 mg | Freq: Four times a day (QID) | INTRAMUSCULAR | Status: DC | PRN

## 2016-11-02 MED ORDER — 0.9 % SODIUM CHLORIDE (POUR BTL) OPTIME
TOPICAL | Status: DC | PRN
Start: 2016-11-02 — End: 2016-11-02
  Administered 2016-11-02: 1000 mL

## 2016-11-02 MED ORDER — ROPIVACAINE HCL 7.5 MG/ML IJ SOLN
INTRAMUSCULAR | Status: DC | PRN
  Administered 2016-11-02: 20 mL via PERINEURAL

## 2016-11-02 MED ORDER — RIVAROXABAN 10 MG PO TABS: 10.0000 mg | ORAL_TABLET | Freq: Every day | ORAL | 0 refills | Status: DC

## 2016-11-02 MED ORDER — MIDAZOLAM HCL 5 MG/5ML IJ SOLN
INTRAMUSCULAR | Status: DC | PRN
  Administered 2016-11-02: 2 mg via INTRAVENOUS

## 2016-11-02 MED ORDER — HYDROCHLOROTHIAZIDE 12.5 MG PO CAPS
12.5000 mg | ORAL_CAPSULE | Freq: Two times a day (BID) | ORAL | Status: DC
  Administered 2016-11-02 – 2016-11-04 (×4): 12.5 mg via ORAL
  Filled 2016-11-02 (×4): qty 1

## 2016-11-02 MED ORDER — METOCLOPRAMIDE HCL 5 MG PO TABS: 5.0000 mg | ORAL_TABLET | Freq: Three times a day (TID) | ORAL | Status: DC | PRN

## 2016-11-02 MED ORDER — BISACODYL 10 MG RE SUPP: 10.0000 mg | Freq: Every day | RECTAL | Status: DC | PRN

## 2016-11-02 MED ORDER — DEXTROSE 5 % IV SOLN
500.0000 mg | Freq: Four times a day (QID) | INTRAVENOUS | Status: DC | PRN
  Filled 2016-11-02: qty 5

## 2016-11-02 MED ORDER — MIDAZOLAM HCL 2 MG/2ML IJ SOLN
INTRAMUSCULAR | Status: AC
  Filled 2016-11-02: qty 2

## 2016-11-02 MED ORDER — CEFAZOLIN SODIUM-DEXTROSE 2-4 GM/100ML-% IV SOLN
2.0000 g | Freq: Four times a day (QID) | INTRAVENOUS | Status: AC
  Administered 2016-11-02 (×2): 2 g via INTRAVENOUS
  Filled 2016-11-02 (×2): qty 100

## 2016-11-02 MED ORDER — METHOCARBAMOL 500 MG PO TABS: 500.0000 mg | ORAL_TABLET | Freq: Three times a day (TID) | ORAL | 0 refills | Status: AC | PRN

## 2016-11-02 MED ORDER — ACETAMINOPHEN 325 MG PO TABS
650.0000 mg | ORAL_TABLET | Freq: Four times a day (QID) | ORAL | Status: DC | PRN
  Administered 2016-11-02: 650 mg via ORAL

## 2016-11-02 MED ORDER — MIDAZOLAM HCL 2 MG/2ML IJ SOLN
INTRAMUSCULAR | Status: AC
  Administered 2016-11-02: 2 mg
  Filled 2016-11-02: qty 2

## 2016-11-02 MED ORDER — ONDANSETRON HCL 4 MG PO TABS: 4.0000 mg | ORAL_TABLET | Freq: Three times a day (TID) | ORAL | 0 refills | Status: DC | PRN

## 2016-11-02 MED ORDER — ACETAMINOPHEN 325 MG PO TABS
ORAL_TABLET | ORAL | Status: AC
  Administered 2016-11-02: 650 mg via ORAL
  Filled 2016-11-02: qty 2

## 2016-11-02 MED ORDER — DOCUSATE SODIUM 100 MG PO CAPS
100.0000 mg | ORAL_CAPSULE | Freq: Two times a day (BID) | ORAL | Status: DC
  Administered 2016-11-02 – 2016-11-04 (×4): 100 mg via ORAL
  Filled 2016-11-02 (×4): qty 1

## 2016-11-02 MED ORDER — LORATADINE 10 MG PO TABS
10.0000 mg | ORAL_TABLET | Freq: Every day | ORAL | Status: DC
  Administered 2016-11-02 – 2016-11-04 (×3): 10 mg via ORAL
  Filled 2016-11-02 (×3): qty 1

## 2016-11-02 MED ORDER — OXYCODONE-ACETAMINOPHEN 10-325 MG PO TABS: 1.0000 | ORAL_TABLET | Freq: Four times a day (QID) | ORAL | 0 refills | Status: DC | PRN

## 2016-11-02 MED ORDER — METOCLOPRAMIDE HCL 5 MG/ML IJ SOLN: 5.0000 mg | Freq: Three times a day (TID) | INTRAMUSCULAR | Status: DC | PRN

## 2016-11-02 MED ORDER — HYDROMORPHONE HCL 1 MG/ML IJ SOLN
0.2500 mg | INTRAMUSCULAR | Status: DC | PRN
  Administered 2016-11-02 (×5): 0.5 mg via INTRAVENOUS

## 2016-11-02 MED ORDER — LACTATED RINGERS IV SOLN
INTRAVENOUS | Status: DC
  Administered 2016-11-02: 50 mL/h via INTRAVENOUS
  Administered 2016-11-02 (×2): via INTRAVENOUS

## 2016-11-02 MED ORDER — FENTANYL CITRATE (PF) 250 MCG/5ML IJ SOLN
INTRAMUSCULAR | Status: AC
  Filled 2016-11-02: qty 5

## 2016-11-02 MED ORDER — RIVAROXABAN 10 MG PO TABS
10.0000 mg | ORAL_TABLET | Freq: Every day | ORAL | Status: DC
  Administered 2016-11-03 – 2016-11-04 (×2): 10 mg via ORAL
  Filled 2016-11-02 (×2): qty 1

## 2016-11-02 MED ORDER — SODIUM CHLORIDE 0.9 % IR SOLN
Status: DC | PRN
  Administered 2016-11-02: 1000 mL

## 2016-11-02 MED ORDER — DEXAMETHASONE SODIUM PHOSPHATE 10 MG/ML IJ SOLN
INTRAMUSCULAR | Status: DC | PRN
  Administered 2016-11-02: 10 mg via INTRAVENOUS

## 2016-11-02 MED ORDER — PHENYLEPHRINE HCL 10 MG/ML IJ SOLN
INTRAVENOUS | Status: DC | PRN
  Administered 2016-11-02: 30 ug/min via INTRAVENOUS

## 2016-11-02 MED ORDER — PROPOFOL 10 MG/ML IV BOLUS
INTRAVENOUS | Status: DC | PRN
Start: 2016-11-02 — End: 2016-11-02
  Administered 2016-11-02: 160 mg via INTRAVENOUS
  Administered 2016-11-02: 40 mg via INTRAVENOUS

## 2016-11-02 MED ORDER — LISINOPRIL 20 MG PO TABS
20.0000 mg | ORAL_TABLET | Freq: Two times a day (BID) | ORAL | Status: DC
  Administered 2016-11-02 – 2016-11-04 (×4): 20 mg via ORAL
  Filled 2016-11-02 (×4): qty 1

## 2016-11-02 MED ORDER — HYDROMORPHONE HCL 1 MG/ML IJ SOLN
INTRAMUSCULAR | Status: AC
Start: 2016-11-02 — End: 2016-11-03
  Filled 2016-11-02: qty 1

## 2016-11-02 MED ORDER — DIPHENHYDRAMINE HCL 12.5 MG/5ML PO ELIX: 12.5000 mg | ORAL_SOLUTION | ORAL | Status: DC | PRN

## 2016-11-02 MED ORDER — POTASSIUM CHLORIDE IN NACL 20-0.45 MEQ/L-% IV SOLN
INTRAVENOUS | Status: DC
Start: 2016-11-02 — End: 2016-11-04
  Administered 2016-11-02: 19:00:00 via INTRAVENOUS
  Filled 2016-11-02 (×2): qty 1000

## 2016-11-02 MED ORDER — DEXAMETHASONE SODIUM PHOSPHATE 10 MG/ML IJ SOLN
10.0000 mg | Freq: Once | INTRAMUSCULAR | Status: AC
  Administered 2016-11-03: 10 mg via INTRAVENOUS
  Filled 2016-11-02: qty 1

## 2016-11-02 MED ORDER — BUPIVACAINE IN DEXTROSE 0.75-8.25 % IT SOLN
INTRATHECAL | Status: DC | PRN
  Administered 2016-11-02: 15 mg via INTRATHECAL

## 2016-11-02 MED ORDER — PROPOFOL 500 MG/50ML IV EMUL
INTRAVENOUS | Status: DC | PRN
  Administered 2016-11-02: 75 ug/kg/min via INTRAVENOUS

## 2016-11-02 MED ORDER — SIMVASTATIN 40 MG PO TABS
40.0000 mg | ORAL_TABLET | Freq: Every day | ORAL | Status: DC
  Administered 2016-11-02 – 2016-11-03 (×2): 40 mg via ORAL
  Filled 2016-11-02 (×2): qty 1

## 2016-11-02 MED ORDER — MAGNESIUM CITRATE PO SOLN: 1.0000 | Freq: Once | ORAL | Status: DC | PRN

## 2016-11-02 MED ORDER — GABAPENTIN 300 MG PO CAPS
ORAL_CAPSULE | ORAL | Status: AC
  Administered 2016-11-02: 600 mg
  Filled 2016-11-02: qty 2

## 2016-11-02 MED ORDER — LIDOCAINE 2% (20 MG/ML) 5 ML SYRINGE
INTRAMUSCULAR | Status: DC | PRN
  Administered 2016-11-02: 50 mg via INTRAVENOUS

## 2016-11-02 MED ORDER — EPHEDRINE SULFATE-NACL 50-0.9 MG/10ML-% IV SOSY
PREFILLED_SYRINGE | INTRAVENOUS | Status: DC | PRN
  Administered 2016-11-02: 15 mg via INTRAVENOUS
  Administered 2016-11-02 (×2): 10 mg via INTRAVENOUS

## 2016-11-02 MED ORDER — SENNA 8.6 MG PO TABS
1.0000 | ORAL_TABLET | Freq: Two times a day (BID) | ORAL | Status: DC
Start: 2016-11-02 — End: 2016-11-04
  Administered 2016-11-02 – 2016-11-04 (×4): 8.6 mg via ORAL
  Filled 2016-11-02 (×4): qty 1

## 2016-11-02 SURGICAL SUPPLY — 58 items
BANDAGE ESMARK 6X9 LF (GAUZE/BANDAGES/DRESSINGS) ×1 IMPLANT
BNDG ELASTIC 6X15 VLCR STRL LF (GAUZE/BANDAGES/DRESSINGS) ×3 IMPLANT
BNDG ESMARK 6X9 LF (GAUZE/BANDAGES/DRESSINGS) ×3
BOWL SMART MIX CTS (DISPOSABLE) ×3 IMPLANT
CAPT KNEE PARTIAL 2 ×3 IMPLANT
CEMENT HV SMART SET (Cement) ×3 IMPLANT
CLOSURE STERI-STRIP 1/2X4 (GAUZE/BANDAGES/DRESSINGS) ×1
CLOSURE WOUND 1/2 X4 (GAUZE/BANDAGES/DRESSINGS) ×1
CLSR STERI-STRIP ANTIMIC 1/2X4 (GAUZE/BANDAGES/DRESSINGS) ×2 IMPLANT
COVER SURGICAL LIGHT HANDLE (MISCELLANEOUS) ×3 IMPLANT
CUFF TOURNIQUET SINGLE 34IN LL (TOURNIQUET CUFF) ×3 IMPLANT
DRAPE EXTREMITY T 121X128X90 (DRAPE) IMPLANT
DRAPE HALF SHEET 40X57 (DRAPES) ×3 IMPLANT
DRAPE U-SHAPE 47X51 STRL (DRAPES) ×3 IMPLANT
DRSG MEPILEX BORDER 4X8 (GAUZE/BANDAGES/DRESSINGS) ×3 IMPLANT
DURAPREP 26ML APPLICATOR (WOUND CARE) ×3 IMPLANT
ELECT CAUTERY BLADE 6.4 (BLADE) ×3 IMPLANT
ELECT REM PT RETURN 9FT ADLT (ELECTROSURGICAL) ×3
ELECTRODE REM PT RTRN 9FT ADLT (ELECTROSURGICAL) ×1 IMPLANT
GAUZE SPONGE 4X4 12PLY STRL (GAUZE/BANDAGES/DRESSINGS) ×3 IMPLANT
GAUZE SPONGE 4X4 12PLY STRL LF (GAUZE/BANDAGES/DRESSINGS) ×3 IMPLANT
GLOVE BIOGEL PI ORTHO PRO SZ8 (GLOVE) ×6
GLOVE ORTHO TXT STRL SZ7.5 (GLOVE) ×6 IMPLANT
GLOVE PI ORTHO PRO STRL SZ8 (GLOVE) ×3 IMPLANT
GLOVE SURG ORTHO 8.0 STRL STRW (GLOVE) ×3 IMPLANT
GOWN STRL REUS W/ TWL LRG LVL3 (GOWN DISPOSABLE) ×2 IMPLANT
GOWN STRL REUS W/ TWL XL LVL3 (GOWN DISPOSABLE) ×2 IMPLANT
GOWN STRL REUS W/TWL 2XL LVL3 (GOWN DISPOSABLE) ×3 IMPLANT
GOWN STRL REUS W/TWL LRG LVL3 (GOWN DISPOSABLE) ×4
GOWN STRL REUS W/TWL XL LVL3 (GOWN DISPOSABLE) ×4
HANDPIECE INTERPULSE COAX TIP (DISPOSABLE) ×2
HOOD PEEL AWAY FACE SHEILD DIS (HOOD) ×6 IMPLANT
IMMOBILIZER KNEE 22 (SOFTGOODS) ×3 IMPLANT
IMMOBILIZER KNEE 22 UNIV (SOFTGOODS) ×3 IMPLANT
KIT BASIN OR (CUSTOM PROCEDURE TRAY) ×3 IMPLANT
KIT ROOM TURNOVER OR (KITS) ×3 IMPLANT
MANIFOLD NEPTUNE II (INSTRUMENTS) ×3 IMPLANT
NEEDLE HYPO 21X1.5 SAFETY (NEEDLE) IMPLANT
NS IRRIG 1000ML POUR BTL (IV SOLUTION) ×3 IMPLANT
PACK BLADE SAW RECIP 70 3 PT (BLADE) ×3 IMPLANT
PACK TOTAL JOINT (CUSTOM PROCEDURE TRAY) ×3 IMPLANT
PAD ABD 8X10 STRL (GAUZE/BANDAGES/DRESSINGS) ×3 IMPLANT
PAD ARMBOARD 7.5X6 YLW CONV (MISCELLANEOUS) ×6 IMPLANT
PAD CAST 4YDX4 CTTN HI CHSV (CAST SUPPLIES) ×1 IMPLANT
PADDING CAST COTTON 4X4 STRL (CAST SUPPLIES) ×2
PADDING CAST COTTON 6X4 STRL (CAST SUPPLIES) ×3 IMPLANT
SET HNDPC FAN SPRY TIP SCT (DISPOSABLE) ×1 IMPLANT
STRIP CLOSURE SKIN 1/2X4 (GAUZE/BANDAGES/DRESSINGS) ×2 IMPLANT
SUCTION FRAZIER HANDLE 10FR (MISCELLANEOUS) ×2
SUCTION TUBE FRAZIER 10FR DISP (MISCELLANEOUS) ×1 IMPLANT
SUT VIC AB 0 CT1 27 (SUTURE) ×2
SUT VIC AB 0 CT1 27XBRD ANBCTR (SUTURE) ×1 IMPLANT
SUT VIC AB 1 CT1 27 (SUTURE) ×2
SUT VIC AB 1 CT1 27XBRD ANBCTR (SUTURE) ×1 IMPLANT
SUT VIC AB 3-0 SH 8-18 (SUTURE) ×3 IMPLANT
SYR CONTROL 10ML LL (SYRINGE) IMPLANT
TOWEL OR 17X24 6PK STRL BLUE (TOWEL DISPOSABLE) ×3 IMPLANT
TOWEL OR 17X26 10 PK STRL BLUE (TOWEL DISPOSABLE) ×3 IMPLANT

## 2016-11-02 NOTE — Anesthesia Procedure Notes (Signed)
Spinal  Patient location during procedure: OR Start time: 11/02/2016 11:03 AM End time: 11/02/2016 11:08 AM Staffing Anesthesiologist: Roderic Palau Performed: anesthesiologist  Preanesthetic Checklist Completed: patient identified, surgical consent, pre-op evaluation, timeout performed, IV checked, risks and benefits discussed and monitors and equipment checked Spinal Block Patient position: sitting Prep: DuraPrep Patient monitoring: cardiac monitor, continuous pulse ox and blood pressure Approach: midline Location: L3-4 Injection technique: single-shot Needle Needle type: Pencan  Needle gauge: 24 G Needle length: 9 cm Assessment Sensory level: T8 Events: paresthesia Additional Notes Functioning IV was confirmed and monitors were applied. Sterile prep and drape, including hand hygiene and sterile gloves were used. The patient was positioned and the spine was prepped. The skin was anesthetized with lidocaine.  Free flow of clear CSF was obtained prior to injecting local anesthetic into the CSF.  The spinal needle aspirated freely following injection.  The needle was carefully withdrawn.  The patient tolerated the procedure well.

## 2016-11-02 NOTE — Op Note (Signed)
11/02/2016  1:00 PM  PATIENT:  Thomas [place]    PRE-OPERATIVE DIAGNOSIS:  right knee primary localized osteoarthritis  POST-OPERATIVE DIAGNOSIS:  Same  PROCEDURE:  UNICOMPARTMENTAL RIGHT KNEE  SURGEON:  [name], MD  PHYSICIAN ASSISTANT: Joya Gaskins, OPA-C, present and scrubbed throughout the case, critical for completion in a timely fashion, and for retraction, instrumentation, and closure.  ANESTHESIA:   General  ESTIMATED BLOOD LOSS: 44ml  PREOPERATIVE INDICATIONS:  Thomas Clark is a  56 y.o. male with a diagnosis of djd right knee who failed conservative measures and elected for surgical management.   He has had an excellent outcome after partial knee replacement on the contralateral side and elected for the same procedure.   The risks benefits and alternatives were discussed with the patient preoperatively including but not limited to the risks of infection, bleeding, nerve injury, cardiopulmonary complications, blood clots, the need for revision surgery, among others, and the patient was willing to proceed.  OPERATIVE IMPLANTS: Bi[employer] Oxford mobile bearing medial compartment arthroplasty femur size medium, tibia size C, bearing size 4.  OPERATIVE FINDINGS: Endstage grade 4 medial compartment osteoarthritis. No significant changes in the lateral or patellofemoral joint.  The ACL was intact.  Unique aspects of the case: I did have to cut the tibia twice, first using the 2 mm shim and then the 0 mm shim in order to have adequate gaps.  OPERATIVE PROCEDURE: The patient was brought to the operating room placed in supine position. General anesthesia was administered after spinal anesthesia was unsuccessful. IV antibiotics were given. The lower extremity was placed in the legholder and prepped and draped in usual sterile fashion.  Time out was performed.  The leg was elevated and exsanguinated and the tourniquet was inflated. Anteromedial incision was performed, and I  took care to preserve the MCL. Parapatellar incision was carried out, and the osteophytes were excised, along with the medial meniscus and a small portion of the fat pad.  The extra medullary tibial cutting jig was applied, using the spoon and the 63mm G-Clamp and the 2 mm shim, and I took care to protect the anterior cruciate ligament insertion and the tibial spine. The medial collateral ligament was also protected, and I resected my proximal tibia, matching the anatomic slope.   The proximal tibial bony cut was removed, but the day gap did not appear adequate so I went back and cut the tibia again with the 0 mm shim.  I turned my attention to the femur.  The intramedullary femoral rod was placed using the drill, and then using the appropriate reference, I assembled the femoral jig, setting my posterior cutting block. I resected my posterior femur, used the 0 spigot for the anterior femur, and then measured my gap.   I then used the appropriate mill to match the extension gap to the flexion gap. The second milling was at a 2.  The gaps were then measured again with the appropriate feeler gauges. Once I had balanced flexion and extension gaps, I then completed the preparation of the femur.  I milled off the anterior aspect of the distal femur to prevent impingement. I also exposed the tibia, and selected the above-named component, and then used the cutting jig to prepare the keel slot on the tibia. I also used the awl to curette out the bone to complete the preparation of the keel. The back wall was intact.  I then placed trial components, and it was found to have excellent motion,  and appropriate balance.  I then cemented the components into place, cementing the tibia first, removing all excess cement, and then cementing the femur.  All loose cement was removed.  The real polyethylene insert was applied manually, and the knee was taken through functional range of motion, and found to have excellent  stability and restoration of joint motion, with excellent balance.  The wounds were irrigated copiously, and the parapatellar tissue closed with Vicryl, followed by Vicryl for the subcutaneous tissue, with routine closure with Steri-Strips and sterile gauze.  The tourniquet was released, and the patient was awakened and extubated and returned to PACU in stable and satisfactory condition. There were no complications.

## 2016-11-02 NOTE — Anesthesia Procedure Notes (Signed)
Anesthesia Regional Block: Adductor canal block   Pre-Anesthetic Checklist: ,, timeout performed, Correct Patient, Correct Site, Correct Laterality, Correct Procedure, Correct Position, site marked, Risks and benefits discussed, pre-op evaluation,  At surgeon's request and post-op pain management  Laterality: Right  Prep: Maximum Sterile Barrier Precautions used, chloraprep       Needles:  Injection technique: Single-shot  Needle Type: Echogenic Stimulator Needle     Needle Length: 9cm  Needle Gauge: 21     Additional Needles:   Procedures: ultrasound guided,,,,,,,,  Narrative:  Start time: 11/02/2016 10:04 AM End time: 11/02/2016 10:14 AM Injection made incrementally with aspirations every 5 mL. Anesthesiologist: Roderic Palau  Additional Notes: 2% Lidocaine skin wheel.

## 2016-11-02 NOTE — Anesthesia Preprocedure Evaluation (Signed)
Anesthesia Evaluation  Patient identified by MRN, date of birth, ID band Patient awake    Reviewed: Allergy & Precautions, H&P , NPO status , Patient's Chart, lab work & pertinent test results  Airway Mallampati: III  TM Distance: >3 FB Neck ROM: Full    Dental no notable dental hx. (+) Teeth Intact, Dental Advisory Given   Pulmonary Current Smoker,    Pulmonary exam normal breath sounds clear to auscultation       Cardiovascular hypertension, Pt. on medications  Rhythm:Regular Rate:Normal     Neuro/Psych negative neurological ROS  negative psych ROS   GI/Hepatic negative GI ROS, Neg liver ROS,   Endo/Other  Morbid obesity  Renal/GU negative Renal ROS  negative genitourinary   Musculoskeletal  (+) Arthritis , Osteoarthritis,    Abdominal   Peds  Hematology negative hematology ROS (+)   Anesthesia Other Findings   Reproductive/Obstetrics negative OB ROS                             Anesthesia Physical Anesthesia Plan  ASA: III  Anesthesia Plan: Spinal   Post-op Pain Management:  Regional for Post-op pain   Induction: Intravenous  PONV Risk Score and Plan: 1 and Ondansetron, Dexamethasone, Propofol infusion and Midazolam  Airway Management Planned: Simple Face Mask  Additional Equipment:   Intra-op Plan:   Post-operative Plan:   Informed Consent: I have reviewed the patients History and Physical, chart, labs and discussed the procedure including the risks, benefits and alternatives for the proposed anesthesia with the patient or authorized representative who has indicated his/her understanding and acceptance.   Dental advisory given  Plan Discussed with: CRNA  Anesthesia Plan Comments:         Anesthesia Quick Evaluation

## 2016-11-02 NOTE — Transfer of Care (Signed)
Immediate Anesthesia Transfer of Care Note  Patient: [place]  Procedure(s) Performed: Procedure(s): UNICOMPARTMENTAL RIGHT KNEE (Right)  Patient Location: PACU  Anesthesia Type:General  Level of Consciousness: awake, alert  and oriented  Airway & Oxygen Therapy: Patient Spontanous Breathing and Patient connected to face mask oxygen  Post-op Assessment: Report given to RN and Post -op Vital signs reviewed and stable  Post vital signs: Reviewed and stable  Last Vitals:  Vitals:   11/02/16 1020 11/02/16 1025  BP: 138/78 119/70  Pulse: 78 75  Resp: (!) 21 15  Temp:    SpO2: 96% 97%    Last Pain:  Vitals:   11/02/16 0926  TempSrc: Oral      Patients Stated Pain Goal: 6 (82/64/15 8309)  Complications: No apparent anesthesia complications

## 2016-11-02 NOTE — Evaluation (Signed)
Physical Therapy Evaluation Patient Details Name: Thomas Clark MRN: [id number] DOB: December 21, 1960 Today's Date: 11/02/2016   History of Present Illness  Pt is a 56 y/o male s/p elective R unicompartmental knee replacement. PMH includes obesity, HTN, and L unicompartmental knee replacement.   Clinical Impression  Pt s/p surgery above with deficits below. PTA, pt was independent with use of cane. Upon eval, pt limited by post op pain and weakness, as well as, dizziness, and decreased balance. Pt requiring min guard assist for mobility this session and gait distance limited to chair. Reports wife will be able to assist as needed upon d/c and has all necessary DME at home. Follow up recommendations per MD arrangements. Will continue to follow acutely to maximize functional mobility independence and safety.     Follow Up Recommendations DC plan and follow up therapy as arranged by surgeon;Supervision for mobility/OOB    Equipment Recommendations  None recommended by PT (has all necessary DME )    Recommendations for Other Services       Precautions / Restrictions Precautions Precautions: Knee Precaution Booklet Issued: Yes (comment) Precaution Comments: Reviewed supine ther ex with pt.  Restrictions Weight Bearing Restrictions: Yes RLE Weight Bearing: Weight bearing as tolerated      Mobility  Bed Mobility Overal bed mobility: Needs Assistance Bed Mobility: Supine to Sit     Supine to sit: Supervision     General bed mobility comments: Supervision for safety.   Transfers Overall transfer level: Needs assistance Equipment used: Rolling walker (2 wheeled) Transfers: Sit to/from Stand Sit to Stand: Min guard         General transfer comment: Min guard for safety. Demonstrated safe hand placement.   Ambulation/Gait Ambulation/Gait assistance: Min guard Ambulation Distance (Feet): 10 Feet Assistive device: Rolling walker (2 wheeled) Gait Pattern/deviations: Step-to  pattern;Decreased step length - right;Decreased step length - left;Decreased weight shift to right;Antalgic Gait velocity: Decreased Gait velocity interpretation: Below normal speed for age/gender General Gait Details: Slow, antalgic gait. Pt reporting increased pain and some dizziness, therefore, distance limited to chair. Verbal cues for sequencing with use of RW.   Stairs            Wheelchair Mobility    Modified Rankin (Stroke Patients Only)       Balance Overall balance assessment: Needs assistance Sitting-balance support: No upper extremity supported;Feet supported Sitting balance-Leahy Scale: Good     Standing balance support: Bilateral upper extremity supported;During functional activity Standing balance-Leahy Scale: Poor Standing balance comment: Reliant on RW for stability                              Pertinent Vitals/Pain Pain Assessment: 0-10 Pain Score: 8  Pain Location: R knee  Pain Descriptors / Indicators: Aching;Constant;Operative site guarding Pain Intervention(s): Limited activity within patient's tolerance;Monitored during session;Repositioned;Patient requesting pain meds-RN notified    Home Living Family/patient expects to be discharged to:: Private residence Living Arrangements: Spouse/significant other Available Help at Discharge: Family;Available 24 hours/day Type of Home: House (townhome ) Home Access: Stairs to enter Entrance Stairs-Rails: None Entrance Stairs-Number of Steps: 1 (threshold step ) Home Layout: Able to live on main level with bedroom/bathroom;Two level Home Equipment: Walker - 2 wheels;Bedside commode;Cane - single point;Shower seat      Prior Function Level of Independence: Independent with assistive device(s)         Comments: Used cane for ambulation      Hand Dominance  Dominant Hand: Right    Extremity/Trunk Assessment   Upper Extremity Assessment Upper Extremity Assessment: Defer to OT  evaluation    Lower Extremity Assessment Lower Extremity Assessment: RLE deficits/detail RLE Deficits / Details: Sensory in tact. Deficits consistent with post op pain and weakness.     Cervical / Trunk Assessment Cervical / Trunk Assessment: Normal  Communication   Communication: No difficulties  Cognition Arousal/Alertness: Awake/alert Behavior During Therapy: WFL for tasks assessed/performed Overall Cognitive Status: Within Functional Limits for tasks assessed                                        General Comments General comments (skin integrity, edema, etc.): Pt's wife present during education. Pt's wife concerned as to why PT was seeing him the day of surgery. Educated about benefits of early mobility.     Exercises Total Joint Exercises Ankle Circles/Pumps: AROM;Both;20 reps;Supine Quad Sets: AROM;Right;10 reps;Supine Towel Squeeze: AROM;Both;10 reps;Supine Heel Slides: AROM;Right;10 reps;Supine   Assessment/Plan    PT Assessment Patient needs continued PT services  PT Problem List Decreased strength;Decreased range of motion;Decreased activity tolerance;Decreased balance;Decreased mobility;Decreased knowledge of use of DME;Decreased knowledge of precautions;Pain       PT Treatment Interventions DME instruction;Stair training;Gait training;Functional mobility training;Therapeutic activities;Therapeutic exercise;Balance training;Neuromuscular re-education;Patient/family education    PT Goals (Current goals can be found in the Care Plan section)  Acute Rehab PT Goals Patient Stated Goal: to go home  PT Goal Formulation: With patient Time For Goal Achievement: 11/09/16 Potential to Achieve Goals: Good    Frequency 7X/week   Barriers to discharge        Co-evaluation               AM-PAC PT "6 Clicks" Daily Activity  Outcome Measure Difficulty turning over in bed (including adjusting bedclothes, sheets and blankets)?: None Difficulty  moving from lying on back to sitting on the side of the bed? : None Difficulty sitting down on and standing up from a chair with arms (e.g., wheelchair, bedside commode, etc,.)?: Unable Help needed moving to and from a bed to chair (including a wheelchair)?: A Little Help needed walking in hospital room?: A Little Help needed climbing 3-5 steps with a railing? : A Lot 6 Click Score: 17    End of Session Equipment Utilized During Treatment: Gait belt;Right knee immobilizer Activity Tolerance: No increased pain;Treatment limited secondary to medical complications (Comment) (dizziness ) Patient left: in chair;with call bell/phone within reach;with family/visitor present Nurse Communication: Mobility status PT Visit Diagnosis: Other abnormalities of gait and mobility (R26.89);Pain Pain - Right/Left: Right Pain - part of body: Knee    Time: 0539-7673 PT Time Calculation (min) (ACUTE ONLY): 26 min   Charges:   PT Evaluation $PT Eval Low Complexity: 1 Low PT Treatments $Gait Training: 8-22 mins   PT G Codes:   PT G-Codes **NOT FOR INPATIENT CLASS** Functional Assessment Tool Used: AM-PAC 6 Clicks Basic Mobility Functional Limitation: Mobility: Walking and moving around Mobility: Walking and Moving Around Current Status (A1937): At least 40 percent but less than 60 percent impaired, limited or restricted Mobility: Walking and Moving Around Goal Status [phone]): At least 1 percent but less than 20 percent impaired, limited or restricted    Leighton Ruff, PT, DPT  Acute Rehabilitation Services  Pager: [place] 11/02/2016, 6:48 PM

## 2016-11-02 NOTE — H&P (Signed)
PREOPERATIVE H&P  Chief Complaint: djd right knee  HPI: Thomas Clark is a 56 y.o. male who presents for preoperative history and physical with a diagnosis of djd right knee. Symptoms are rated as moderate to severe, and have been worsening.  This is significantly impairing activities of daily living.  He has elected for surgical management.   He has failed injections, activity modification, anti-inflammatories, and assistive devices.  Preoperative X-rays demonstrate end stage degenerative changes with osteophyte formation, loss of joint space, subchondral sclerosis.  He had contralateral uni with excellent results.    Past Medical History:  Diagnosis Date  . Ankle fracture 1974   "put a cast on it"  . Arthritis    "feet, legs" (03/26/2016)  . High cholesterol   . Hypertension   . Obesity   . Primary localized osteoarthritis of left knee 03/26/2016  . Severe obesity (BMI >= 40) ([place]) 03/26/2016  . Umbilical hernia    Past Surgical History:  Procedure Laterality Date  . COLONOSCOPY W/ BIOPSIES AND POLYPECTOMY    . HERNIA REPAIR  1985   "stomach"  . PARTIAL KNEE ARTHROPLASTY Left 03/26/2016   Procedure: UNICOMPARTMENTAL KNEE ARTHROPLASTY;  Surgeon: Marchia Bond, MD;  Location: [facility];  Service: Orthopedics;  Laterality: Left;  . REPLACEMENT UNICONDYLAR JOINT KNEE Left 03/26/2016   Social History   Social History  . Marital status: Married    Spouse name: N/A  . Number of children: N/A  . Years of education: N/A   Social History Main Topics  . Smoking status: Current Some Day Smoker    Years: 22.00    Types: Cigars  . Smokeless tobacco: Never Used     Comment: 3 in a week  . Alcohol use 1.8 oz/week    3 Cans of beer per week  . Drug use: No  . Sexual activity: Not on file   Other Topics Concern  . Not on file   Social History Narrative  . No narrative on file   No family history on file. Allergies  Allergen Reactions  . Aleve [Naproxen Sodium] Other (See  Comments)    GI bleeds   Prior to Admission medications   Medication Sig Start Date End Date Taking? Authorizing Provider  acetaminophen (TYLENOL) 500 MG tablet Take 1,000 mg by mouth every 6 (six) hours as needed.   Yes [provider]  celecoxib (CELEBREX) 200 MG capsule Take 200 mg by mouth daily.   Yes [provider]  cetirizine (ZYRTEC) 10 MG tablet Take 10 mg by mouth daily.   Yes [provider]  DULoxetine (CYMBALTA) 60 MG capsule Take 60 mg by mouth at bedtime.   Yes [provider]  lisinopril-hydrochlorothiazide (PRINZIDE,ZESTORETIC) 20-12.5 MG tablet Take 1 tablet by mouth 2 (two) times daily.   Yes [provider]  methocarbamol (ROBAXIN) 500 MG tablet Take 1 tablet (500 mg total) by mouth every 8 (eight) hours as needed for muscle spasms. 03/26/16  Yes Marchia Bond, MD  Multiple Vitamin (MULTIVITAMIN WITH MINERALS) TABS tablet Take 1 tablet by mouth 2 (two) times daily.   Yes [provider]  simvastatin (ZOCOR) 40 MG tablet Take 40 mg by mouth daily at 6 PM.   Yes [provider]  Wheat Dextrin (BENEFIBER DRINK MIX) PACK Take 1 Dose by mouth 1 day or 1 dose.   Yes [provider]  oxyCODONE-acetaminophen (PERCOCET) 10-325 MG tablet Take 1-2 tablets by mouth every 6 (six) hours as needed for pain. MAXIMUM  TOTAL ACETAMINOPHEN DOSE IS 4000 MG PER DAY 03/26/16   Marchia Bond, MD  sennosides-docusate sodium (SENOKOT-S) 8.6-50 MG tablet Take 2 tablets by mouth daily. 03/26/16   Marchia Bond, MD  Wheat Dextrin-Calcium ([employer] EASY FIBER/CALCIUM) CHEW Chew 2 tablets by mouth 2 (two) times daily.    [provider]     Positive ROS: All other systems have been reviewed and were otherwise negative with the exception of those mentioned in the HPI and as above.  Physical Exam: General: Alert, no acute distress Cardiovascular: No pedal edema Respiratory: No cyanosis, no use of accessory musculature GI: No  organomegaly, abdomen is soft and non-tender Skin: No lesions in the area of chief complaint Neurologic: Sensation intact distally Psychiatric: Patient is competent for consent with normal mood and affect Lymphatic: No axillary or cervical lymphadenopathy  MUSCULOSKELETAL: right knee with varus alignment, crepitance and pain medially, rom 0-115  Estimated body mass index is 49.42 kg/m as calculated from the following:   Height as of 10/22/16: 5\' 10"  (1.778 m).   Weight as of this encounter: 156.2 kg (344 lb 6.4 oz).   Assessment: djd right knee   Plan: Plan for Procedure(s): UNICOMPARTMENTAL RIGHT KNEE  The risks benefits and alternatives were discussed with the patient including but not limited to the risks of nonoperative treatment, versus surgical intervention including infection, bleeding, nerve injury,  blood clots, cardiopulmonary complications, morbidity, mortality, among others, and they were willing to proceed.   Thomas Bridge, MD Cell [phone]   11/02/2016 9:46 AM

## 2016-11-02 NOTE — Anesthesia Postprocedure Evaluation (Signed)
Anesthesia Post Note  Patient: Thomas Clark  Procedure(s) Performed: Procedure(s) (LRB): UNICOMPARTMENTAL RIGHT KNEE (Right)     Patient location during evaluation: PACU Anesthesia Type: General and Regional Level of consciousness: awake and alert Pain management: pain level controlled Vital Signs Assessment: post-procedure vital signs reviewed and stable Respiratory status: spontaneous breathing, nonlabored ventilation, respiratory function stable and patient connected to nasal cannula oxygen Cardiovascular status: blood pressure returned to baseline and stable Postop Assessment: no signs of nausea or vomiting Anesthetic complications: no    Last Vitals:  Vitals:   11/02/16 1445 11/02/16 1500  BP: 126/82 136/70  Pulse: 89 89  Resp: 18 17  Temp:    SpO2: 92% 95%    Last Pain:  Vitals:   11/02/16 0926  TempSrc: Oral                 Ryan P Ellender

## 2016-11-02 NOTE — Anesthesia Procedure Notes (Signed)
Procedure Name: LMA Insertion Date/Time: 11/02/2016 11:28 AM Performed by: Noralyn Pick D Pre-anesthesia Checklist: Patient identified, Emergency Drugs available, Suction available and Patient being monitored Patient Re-evaluated:Patient Re-evaluated prior to induction Oxygen Delivery Method: Circle system utilized Preoxygenation: Pre-oxygenation with 100% oxygen Induction Type: IV induction Ventilation: Mask ventilation without difficulty LMA: LMA inserted LMA Size: 4.0 Tube type: Oral Number of attempts: 1 Placement Confirmation: positive ETCO2 and breath sounds checked- equal and bilateral Tube secured with: Tape Dental Injury: Teeth and Oropharynx as per pre-operative assessment

## 2016-11-03 ENCOUNTER — Encounter (HOSPITAL_COMMUNITY): Payer: Self-pay | Admitting: Orthopedic Surgery

## 2016-11-03 DIAGNOSIS — M1711 Unilateral primary osteoarthritis, right knee: Secondary | ICD-10-CM | POA: Diagnosis not present

## 2016-11-03 LAB — BASIC METABOLIC PANEL
Anion gap: 8 (ref 5–15)
BUN: 10 mg/dL (ref 6–20)
CALCIUM: 8.6 mg/dL — AB (ref 8.9–10.3)
CO2: 29 mmol/L (ref 22–32)
CREATININE: 0.93 mg/dL (ref 0.61–1.24)
Chloride: 100 mmol/L — ABNORMAL LOW (ref 101–111)
GFR calc Af Amer: >60 mL/min (ref >60)
GLUCOSE: 159 mg/dL — AB (ref 65–99)
POTASSIUM: 3.2 mmol/L — AB (ref 3.5–5.1)
SODIUM: 137 mmol/L (ref 135–145)

## 2016-11-03 LAB — CBC
HEMATOCRIT: 37.9 % — AB (ref 39.0–52.0)
HEMOGLOBIN: 12.6 g/dL — AB (ref 13.0–17.0)
MCH: 29.4 pg (ref 26.0–34.0)
MCHC: 33.2 g/dL (ref 30.0–36.0)
MCV: 88.6 fL (ref 78.0–100.0)
PLATELETS: 199 10*3/uL (ref 150–400)
RBC: 4.28 MIL/uL (ref 4.22–5.81)
RDW: 12.9 % (ref 11.5–15.5)
WBC: 11.2 10*3/uL — ABNORMAL HIGH (ref 4.0–10.5)

## 2016-11-03 MED ORDER — DIAZEPAM 5 MG PO TABS
5.0000 mg | ORAL_TABLET | Freq: Four times a day (QID) | ORAL | Status: DC | PRN
  Administered 2016-11-03 – 2016-11-04 (×3): 5 mg via ORAL
  Filled 2016-11-03 (×3): qty 1

## 2016-11-03 MED ORDER — HYDROMORPHONE HCL 1 MG/ML IJ SOLN
1.0000 mg | INTRAMUSCULAR | Status: DC | PRN
  Administered 2016-11-03 (×3): 1 mg via INTRAVENOUS
  Filled 2016-11-03 (×3): qty 1

## 2016-11-03 NOTE — Progress Notes (Signed)
Pt c/o of pain constantly, states oxycodone only last about 1.5 hours and pain gets up to a 10 again. Pt given IV dilaudid 0.5mg , without much help. Pt's wife came in and was voicing concerns about pain management and wanted doctor called to do something more. Pt was due at the time for more po oxycodone and robaxin, which was given. Attempted several times to get thru to office without success, (automated info came on, followed but no answer none of the times called 1205-1215, and again at 1301) Dr Robbie Lis # in chart. He was called, message left on his voice mail, he returned call and orders received. Will try additional spasm med, valium and increase IV dilaudid to 1mg .  And discharge canceled for today. Will inform pt.

## 2016-11-03 NOTE — Progress Notes (Signed)
Physical Therapy Treatment Patient Details Name: Thomas Clark MRN: [id number] DOB: 03-02-1960 Today's Date: 11/03/2016    History of Present Illness Pt is a 56 y/o male s/p elective R unicompartmental knee replacement. PMH includes obesity, HTN, and L unicompartmental knee replacement.     PT Comments    Pt performed decreased activity due to a rise in pain which halted d/c home today.  Pt reports pain is more controlled but he is hesitant on progressing mobility.  Gait and exercise limited this session.     Follow Up Recommendations  DC plan and follow up therapy as arranged by surgeon;Supervision for mobility/OOB     Equipment Recommendations  None recommended by PT (has all DME necessary)    Recommendations for Other Services       Precautions / Restrictions Precautions Precautions: Knee Precaution Booklet Issued: Yes (comment) Precaution Comments: Reviewed supine ther ex with pt.  Restrictions Weight Bearing Restrictions: Yes RLE Weight Bearing: Weight bearing as tolerated    Mobility  Bed Mobility Overal bed mobility: Needs Assistance Bed Mobility: Supine to Sit;Sit to Supine     Supine to sit: Min assist Sit to supine: Min assist   General bed mobility comments: Pt required min assist for RLE advancement out of bed.    Transfers Overall transfer level: Needs assistance Equipment used: Rolling walker (2 wheeled) Transfers: Sit to/from Stand Sit to Stand: Min guard         General transfer comment: Cues for hand and foot placement to control descent to seated surface.  Ambulation/Gait Ambulation/Gait assistance: Min guard Ambulation Distance (Feet): 150 Feet Assistive device: Rolling walker (2 wheeled) Gait Pattern/deviations: Decreased step length - right;Decreased step length - left;Decreased weight shift to right;Antalgic;Step-through pattern Gait velocity: Decreased Gait velocity interpretation: Below normal speed for age/gender General Gait  Details: Cues for upper trunk control.  Cues for increasing step length on L.  Cues for heel strike and knee extension in stance phase on R.     Stairs            Wheelchair Mobility    Modified Rankin (Stroke Patients Only)       Balance Overall balance assessment: Needs assistance   Sitting balance-Leahy Scale: Good     Standing balance support: Bilateral upper extremity supported;During functional activity Standing balance-Leahy Scale: Poor Standing balance comment: Reliant on RW for stability                             Cognition Arousal/Alertness: Awake/alert Behavior During Therapy: WFL for tasks assessed/performed Overall Cognitive Status: Within Functional Limits for tasks assessed                                        Exercises Total Joint Exercises Ankle Circles/Pumps: AROM;Both;20 reps;Supine Quad Sets: AROM;Right;10 reps;Supine Heel Slides: AROM;Right;10 reps;Supine     General Comments        Pertinent Vitals/Pain Pain Assessment: 0-10 Pain Score: 6  Pain Location: R knee  Pain Descriptors / Indicators: Aching;Constant;Operative site guarding Pain Intervention(s): Monitored during session;Repositioned;Ice applied    Home Living                      Prior Function            PT Goals (current goals can now be found in the care  plan section) Acute Rehab PT Goals Patient Stated Goal: to go home  Potential to Achieve Goals: Good Progress towards PT goals: Progressing toward goals    Frequency    7X/week      PT Plan Current plan remains appropriate    Co-evaluation              AM-PAC PT "6 Clicks" Daily Activity  Outcome Measure  Difficulty turning over in bed (including adjusting bedclothes, sheets and blankets)?: Unable Difficulty moving from lying on back to sitting on the side of the bed? : A Lot Difficulty sitting down on and standing up from a chair with arms (e.g., wheelchair,  bedside commode, etc,.)?: A Lot Help needed moving to and from a bed to chair (including a wheelchair)?: A Little Help needed walking in hospital room?: A Little Help needed climbing 3-5 steps with a railing? : A Little 6 Click Score: 14    End of Session Equipment Utilized During Treatment: Gait belt Activity Tolerance: Patient tolerated treatment well Patient left: in chair;with call bell/phone within reach;with family/visitor present Nurse Communication: Mobility status;Patient requests pain meds PT Visit Diagnosis: Other abnormalities of gait and mobility (R26.89);Pain Pain - Right/Left: Right Pain - part of body: Knee     Time: 4403-4742 PT Time Calculation (min) (ACUTE ONLY): 17 min  Charges:  $Gait Training: 8-22 mins                   G Codes:       Governor Rooks, PTA pager [phone]    Cristela Blue 11/03/2016, 3:39 PM

## 2016-11-03 NOTE — Discharge Summary (Signed)
Physician Discharge Summary  Patient ID: RAKWON LETOURNEAU MRN: [id number] DOB/AGE: [birth year] 56 y.o.  Admit date: 11/02/2016 Discharge date: 11/03/2016  Admission Diagnoses:  Primary localized osteoarthritis of right knee  Discharge Diagnoses:  Principal Problem:   Primary localized osteoarthritis of right knee Active Problems:   Severe obesity (BMI >= 40) (HCC)   S/P knee replacement   Past Medical History:  Diagnosis Date  . Ankle fracture 1974   "put a cast on it"  . Arthritis    "feet, legs" (03/26/2016)  . High cholesterol   . Hypertension   . Obesity   . Primary localized osteoarthritis of left knee 03/26/2016  . Primary localized osteoarthritis of right knee 11/02/2016  . Severe obesity (BMI >= 40) ([facility]) 03/26/2016  . Umbilical hernia     Surgeries: Procedure(s): UNICOMPARTMENTAL RIGHT KNEE on 11/02/2016   Consultants (if any):   Discharged Condition: Improved  Hospital Course: BILL YOHN is an 56 y.o. male who was admitted 11/02/2016 with a diagnosis of Primary localized osteoarthritis of right knee and went to the operating room on 11/02/2016 and underwent the above named procedures.    He was given perioperative antibiotics:  Anti-infectives    Start     Dose/Rate Route Frequency Ordered Stop   11/02/16 1730  ceFAZolin (ANCEF) IVPB 2g/100 mL premix     2 g 200 mL/hr over 30 Minutes Intravenous Every 6 hours 11/02/16 1420 11/02/16 2308   11/02/16 0600  ceFAZolin (ANCEF) 3 g in dextrose 5 % 50 mL IVPB     3 g 130 mL/hr over 30 Minutes Intravenous On call to O.R. 10/31/16 1441 11/02/16 1111    .  He was given sequential compression devices, early ambulation, and xarelto for DVT prophylaxis.  He benefited maximally from the hospital stay and there were no complications.    Recent vital signs:  Vitals:   11/03/16 0219 11/03/16 0715  BP: 118/73 111/68  Pulse: 81 86  Resp: 18 16  Temp: 98.2 F (36.8 C) 98.3 F (36.8 C)  SpO2: 96% 98%    Recent  laboratory studies:  Lab Results  Component Value Date   HGB 12.6 (L) 11/03/2016   HGB 13.6 10/22/2016   HGB 12.4 (L) 03/27/2016   Lab Results  Component Value Date   WBC 11.2 (H) 11/03/2016   PLT 199 11/03/2016   No results found for: INR Lab Results  Component Value Date   NA 137 11/03/2016   K 3.2 (L) 11/03/2016   CL 100 (L) 11/03/2016   CO2 29 11/03/2016   BUN 10 11/03/2016   CREATININE 0.93 11/03/2016   GLUCOSE 159 (H) 11/03/2016    Discharge Medications:   Allergies as of 11/03/2016      Reactions   Aleve [naproxen Sodium] Other (See Comments)   GI bleeds      Medication List    STOP taking these medications   acetaminophen 500 MG tablet Commonly known as:  TYLENOL     TAKE these medications   BENEFIBER DRINK MIX Pack Take 1 Dose by mouth 1 day or 1 dose.   celecoxib 200 MG capsule Commonly known as:  CELEBREX Take 200 mg by mouth daily.   cetirizine 10 MG tablet Commonly known as:  ZYRTEC Take 10 mg by mouth daily.   [employer] EASY FIBER/CALCIUM Chew Chew 2 tablets by mouth 2 (two) times daily.   DULoxetine 60 MG capsule Commonly known as:  CYMBALTA Take 60 mg by mouth at bedtime.  lisinopril-hydrochlorothiazide 20-12.5 MG tablet Commonly known as:  PRINZIDE,ZESTORETIC Take 1 tablet by mouth 2 (two) times daily.   methocarbamol 500 MG tablet Commonly known as:  ROBAXIN Take 1 tablet (500 mg total) by mouth every 8 (eight) hours as needed for muscle spasms.   multivitamin with minerals Tabs tablet Take 1 tablet by mouth 2 (two) times daily.   ondansetron 4 MG tablet Commonly known as:  ZOFRAN Take 1 tablet (4 mg total) by mouth every 8 (eight) hours as needed for nausea or vomiting.   oxyCODONE-acetaminophen 10-325 MG tablet Commonly known as:  PERCOCET Take 1-2 tablets by mouth every 6 (six) hours as needed for pain. MAXIMUM TOTAL ACETAMINOPHEN DOSE IS 4000 MG PER DAY   rivaroxaban 10 MG Tabs tablet Commonly known as:  XARELTO Take 1  tablet (10 mg total) by mouth daily.   sennosides-docusate sodium 8.6-50 MG tablet Commonly known as:  SENOKOT-S Take 2 tablets by mouth daily.   simvastatin 40 MG tablet Commonly known as:  ZOCOR Take 40 mg by mouth daily at 6 PM.            Discharge Care Instructions        Start     Ordered   11/02/16 0000  methocarbamol (ROBAXIN) 500 MG tablet  Every 8 hours PRN     11/02/16 1126   11/02/16 0000  oxyCODONE-acetaminophen (PERCOCET) 10-325 MG tablet  Every 6 hours PRN     11/02/16 1126   11/02/16 0000  ondansetron (ZOFRAN) 4 MG tablet  Every 8 hours PRN     11/02/16 1126   11/02/16 0000  rivaroxaban (XARELTO) 10 MG TABS tablet  Daily     11/02/16 1126      Diagnostic Studies: Dg Knee Right Port  Result Date: 11/02/2016 CLINICAL DATA:  Postop right knee surgery EXAM: PORTABLE RIGHT KNEE - 1-2 VIEW COMPARISON:  None. FINDINGS: Evidence of medial compartment hemiarthroplasty. No hardware bony complicating feature. Large joint effusion. Soft tissue and joint space gas noted. IMPRESSION: Medial compartment hemiarthroplasty with joint effusion and joint space gas. No complicating feature. Electronically Signed   By: Rolm Baptise M.D.   On: 11/02/2016 15:00    Disposition: 01-Home or Self Care    Follow-up Information    Marchia Bond, MD. Schedule an appointment as soon as possible for a visit in 2 weeks.   Specialty:  Orthopedic Surgery Contact information: [address] [place] [address] [phone]            Signed: [name] 11/03/2016, 10:23 AM

## 2016-11-03 NOTE — Discharge Instructions (Signed)
INSTRUCTIONS AFTER JOINT REPLACEMENT  ° °o Remove items at home which could result in a fall. This includes throw rugs or furniture in walking pathways °o ICE to the affected joint every three hours while awake for 30 minutes at a time, for at least the first 3-5 days, and then as needed for pain and swelling.  Continue to use ice for pain and swelling. You may notice swelling that will progress down to the foot and ankle.  This is normal after surgery.  Elevate your leg when you are not up walking on it.   °o Continue to use the breathing machine you got in the hospital (incentive spirometer) which will help keep your temperature down.  It is common for your temperature to cycle up and down following surgery, especially at night when you are not up moving around and exerting yourself.  The breathing machine keeps your lungs expanded and your temperature down. ° ° °DIET:  As you were doing prior to hospitalization, we recommend a well-balanced diet. ° °DRESSING / WOUND CARE / SHOWERING ° °You may change your dressing 3-5 days after surgery.  Then change the dressing every day with sterile gauze.  Please use good hand washing techniques before changing the dressing.  Do not use any lotions or creams on the incision until instructed by your surgeon. ° °ACTIVITY ° °o Increase activity slowly as tolerated, but follow the weight bearing instructions below.   °o No driving for 6 weeks or until further direction given by your physician.  You cannot drive while taking narcotics.  °o No lifting or carrying greater than 10 lbs. until further directed by your surgeon. °o Avoid periods of inactivity such as sitting longer than an hour when not asleep. This helps prevent blood clots.  °o You may return to work once you are authorized by your doctor.  ° ° ° °WEIGHT BEARING  ° °Weight bearing as tolerated with assist device (walker, cane, etc) as directed, use it as long as suggested by your surgeon or therapist, typically at  least 4-6 weeks. ° ° °EXERCISES ° °Results after joint replacement surgery are often greatly improved when you follow the exercise, range of motion and muscle strengthening exercises prescribed by your doctor. Safety measures are also important to protect the joint from further injury. Any time any of these exercises cause you to have increased pain or swelling, decrease what you are doing until you are comfortable again and then slowly increase them. If you have problems or questions, call your caregiver or physical therapist for advice.  ° °Rehabilitation is important following a joint replacement. After just a few days of immobilization, the muscles of the leg can become weakened and shrink (atrophy).  These exercises are designed to build up the tone and strength of the thigh and leg muscles and to improve motion. Often times heat used for twenty to thirty minutes before working out will loosen up your tissues and help with improving the range of motion but do not use heat for the first two weeks following surgery (sometimes heat can increase post-operative swelling).  ° °These exercises can be done on a training (exercise) mat, on the floor, on a table or on a bed. Use whatever works the best and is most comfortable for you.    Use music or television while you are exercising so that the exercises are a pleasant break in your day. This will make your life better with the exercises acting as a break   in your routine that you can look forward to.   Perform all exercises about fifteen times, three times per day or as directed.  You should exercise both the operative leg and the other leg as well. ° °Exercises include: °  °• Quad Sets - Tighten up the muscle on the front of the thigh (Quad) and hold for 5-10 seconds.   °• Straight Leg Raises - With your knee straight (if you were given a brace, keep it on), lift the leg to 60 degrees, hold for 3 seconds, and slowly lower the leg.  Perform this exercise against  resistance later as your leg gets stronger.  °• Leg Slides: Lying on your back, slowly slide your foot toward your buttocks, bending your knee up off the floor (only go as far as is comfortable). Then slowly slide your foot back down until your leg is flat on the floor again.  °• Angel Wings: Lying on your back spread your legs to the side as far apart as you can without causing discomfort.  °• Hamstring Strength:  Lying on your back, push your heel against the floor with your leg straight by tightening up the muscles of your buttocks.  Repeat, but this time bend your knee to a comfortable angle, and push your heel against the floor.  You may put a pillow under the heel to make it more comfortable if necessary.  ° °A rehabilitation program following joint replacement surgery can speed recovery and prevent re-injury in the future due to weakened muscles. Contact your doctor or a physical therapist for more information on knee rehabilitation.  ° ° °CONSTIPATION ° °Constipation is defined medically as fewer than three stools per week and severe constipation as less than one stool per week.  Even if you have a regular bowel pattern at home, your normal regimen is likely to be disrupted due to multiple reasons following surgery.  Combination of anesthesia, postoperative narcotics, change in appetite and fluid intake all can affect your bowels.  ° °YOU MUST use at least one of the following options; they are listed in order of increasing strength to get the job done.  They are all available over the counter, and you may need to use some, POSSIBLY even all of these options:   ° °Drink plenty of fluids (prune juice may be helpful) and high fiber foods °Colace 100 mg by mouth twice a day  °Senokot for constipation as directed and as needed Dulcolax (bisacodyl), take with full glass of water  °Miralax (polyethylene glycol) once or twice a day as needed. ° °If you have tried all these things and are unable to have a bowel  movement in the first 3-4 days after surgery call either your surgeon or your primary doctor.   ° °If you experience loose stools or diarrhea, hold the medications until you stool forms back up.  If your symptoms do not get better within 1 week or if they get worse, check with your doctor.  If you experience "the worst abdominal pain ever" or develop nausea or vomiting, please contact the office immediately for further recommendations for treatment. ° ° °ITCHING:  If you experience itching with your medications, try taking only a single pain pill, or even half a pain pill at a time.  You can also use Benadryl over the counter for itching or also to help with sleep.  ° °TED HOSE STOCKINGS:  Use stockings on both legs until for at least 2 weeks or as   directed by physician office. They may be removed at night for sleeping.  MEDICATIONS:  See your medication summary on the After Visit Summary that nursing will review with you.  You may have some home medications which will be placed on hold until you complete the course of blood thinner medication.  It is important for you to complete the blood thinner medication as prescribed.  PRECAUTIONS:  If you experience chest pain or shortness of breath - call 911 immediately for transfer to the hospital emergency department.   If you develop a fever greater that 101 F, purulent drainage from wound, increased redness or drainage from wound, foul odor from the wound/dressing, or calf pain - CONTACT YOUR SURGEON.                                                   FOLLOW-UP APPOINTMENTS:  If you do not already have a post-op appointment, please call the office for an appointment to be seen by your surgeon.  Guidelines for how soon to be seen are listed in your After Visit Summary, but are typically between 1-4 weeks after surgery.   MAKE SURE YOU:   Understand these instructions.   Get help right away if you are not doing well or get worse.    Thank you for  letting us be a part of your medical care team.  It is a privilege we respect greatly.  We hope these instructions will help you stay on track for a fast and full recovery!    Information on my medicine - XARELTO (Rivaroxaban)  This medication education was reviewed with me or my healthcare representative as part of my discharge preparation.  Why was Xarelto prescribed for you? Xarelto was prescribed for you to reduce the risk of blood clots forming after orthopedic surgery. The medical term for these abnormal blood clots is venous thromboembolism (VTE).  What do you need to know about xarelto ? Take your Xarelto ONCE DAILY at the same time every day. You may take it either with or without food.  If you have difficulty swallowing the tablet whole, you may crush it and mix in applesauce just prior to taking your dose.  Take Xarelto exactly as prescribed by your doctor and DO NOT stop taking Xarelto without talking to the doctor who prescribed the medication.  Stopping without other VTE prevention medication to take the place of Xarelto may increase your risk of developing a clot.  After discharge, you should have regular check-up appointments with your healthcare provider that is prescribing your Xarelto.    What do you do if you miss a dose? If you miss a dose, take it as soon as you remember on the same day then continue your regularly scheduled once daily regimen the next day. Do not take two doses of Xarelto on the same day.   Important Safety Information A possible side effect of Xarelto is bleeding. You should call your healthcare provider right away if you experience any of the following: ? Bleeding from an injury or your nose that does not stop. ? Unusual colored urine (red or dark brown) or unusual colored stools (red or black). ? Unusual bruising for unknown reasons. ? A serious fall or if you hit your head (even if there is no bleeding).  Some medicines may interact  with  Xarelto and might increase your risk of bleeding while on Xarelto. To help avoid this, consult your healthcare provider or pharmacist prior to using any new prescription or non-prescription medications, including herbals, vitamins, non-steroidal anti-inflammatory drugs (NSAIDs) and supplements.  This website has more information on Xarelto: [email or web address]

## 2016-11-03 NOTE — Evaluation (Signed)
Occupational Therapy Evaluation and Discharge Patient Details Name: Thomas Clark MRN: [id number] DOB: 03-12-1960 Today's Date: 11/03/2016    History of Present Illness Pt is a 56 y/o male s/p elective R unicompartmental knee replacement. PMH includes obesity, HTN, and L unicompartmental knee replacement.    Clinical Impression   Pt reports he was independent with ADL PTA. Currently pt requires supervision for functional mobility and min assist for LB ADL. Pt planning to d/c home with 24/7 assist from his wife. No further acute OT needs identified; signing off at this time. Please re-consult if needs change. Thank you for this referral.    Follow Up Recommendations  No OT follow up;Supervision - Intermittent    Equipment Recommendations  None recommended by OT    Recommendations for Other Services       Precautions / Restrictions Precautions Precautions: Knee Restrictions Weight Bearing Restrictions: Yes RLE Weight Bearing: Weight bearing as tolerated      Mobility Bed Mobility    General bed mobility comments: Pt OOB in chair upon arrival  Transfers Overall transfer level: Needs assistance Equipment used: Rolling walker (2 wheeled) Transfers: Sit to/from Stand Sit to Stand: Supervision         General transfer comment: for safety, good hand placement and technique    Balance Overall balance assessment: Needs assistance Sitting-balance support: Feet supported;No upper extremity supported Sitting balance-Leahy Scale: Good     Standing balance support: Bilateral upper extremity supported Standing balance-Leahy Scale: Poor Standing balance comment: RW for support                           ADL either performed or assessed with clinical judgement   ADL Overall ADL's : Needs assistance/impaired Eating/Feeding: Set up;Sitting   Grooming: Set up;Sitting   Upper Body Bathing: Set up;Sitting   Lower Body Bathing: Minimal assistance;Sit to/from  stand   Upper Body Dressing : Set up;Sitting   Lower Body Dressing: Minimal assistance;Sit to/from stand Lower Body Dressing Details (indicate cue type and reason): Educated on compensatory strategies for LB ADL. Wife to assist as needed Toilet Transfer: Supervision/safety;Ambulation;RW Toilet Transfer Details (indicate cue type and reason): Simulated by sit to stand from chair with functional mobility in room       Tub/Shower Transfer Details (indicate cue type and reason): Pt plans to sponge bathe initially with wife assisting. Functional mobility during ADLs: Supervision/safety;Rolling walker       Vision         Perception     Praxis      Pertinent Vitals/Pain Pain Assessment: 0-10 Pain Score: 7  Pain Location: R knee Pain Descriptors / Indicators: Aching;Sore Pain Intervention(s): Limited activity within patient's tolerance;Monitored during session;Premedicated before session;Ice applied     Hand Dominance Right   Extremity/Trunk Assessment Upper Extremity Assessment Upper Extremity Assessment: Overall WFL for tasks assessed   Lower Extremity Assessment Lower Extremity Assessment: Defer to PT evaluation   Cervical / Trunk Assessment Cervical / Trunk Assessment: Normal   Communication Communication Communication: No difficulties   Cognition Arousal/Alertness: Awake/alert Behavior During Therapy: WFL for tasks assessed/performed Overall Cognitive Status: Within Functional Limits for tasks assessed                                     General Comments       Exercises    Shoulder Instructions  Home Living Family/patient expects to be discharged to:: Private residence Living Arrangements: Spouse/significant other Available Help at Discharge: Family;Available 24 hours/day Type of Home: Other(Comment) (townhome) Home Access: Stairs to enter [employer] of Steps: 1 Entrance Stairs-Rails: None Home Layout: Two level;Able to  live on main level with bedroom/bathroom     Bathroom Shower/Tub: Tub/shower unit;Curtain   Bathroom Toilet: Handicapped height     Home Equipment: Environmental consultant - 2 wheels;Bedside commode;Cane - single point;Grab bars - tub/shower   Additional Comments: Pt reports he plans to sponge bathe initially       Prior Functioning/Environment Level of Independence: Independent with assistive device(s)        Comments: Used cane for ambulation         OT Problem List:        OT Treatment/Interventions:      OT Goals(Current goals can be found in the care plan section) Acute Rehab OT Goals Patient Stated Goal: to go home  OT Goal Formulation: All assessment and education complete, DC therapy  OT Frequency:     Barriers to D/C:            Co-evaluation              AM-PAC PT "6 Clicks" Daily Activity     Outcome Measure Help from another person eating meals?: None Help from another person taking care of personal grooming?: None Help from another person toileting, which includes using toliet, bedpan, or urinal?: A Little Help from another person bathing (including washing, rinsing, drying)?: A Little Help from another person to put on and taking off regular upper body clothing?: None Help from another person to put on and taking off regular lower body clothing?: A Little 6 Click Score: 21   End of Session Equipment Utilized During Treatment: Rolling walker Nurse Communication: Mobility status;Patient requests pain meds  Activity Tolerance: Patient tolerated treatment well Patient left: in chair;with call bell/phone within reach;with nursing/sitter in room  OT Visit Diagnosis: Other abnormalities of gait and mobility (R26.89);Pain Pain - Right/Left: Right Pain - part of body: Knee                Time: 8466-5993 OT Time Calculation (min): 10 min Charges:  OT General Charges $OT Visit: 1 Visit OT Evaluation $OT Eval Moderate Complexity: 1 Mod G-Codes: OT G-codes **NOT  FOR INPATIENT CLASS** Functional Assessment Tool Used: Clinical judgement Functional Limitation: Self care Self Care Current Status (T7017): At least 1 percent but less than 20 percent impaired, limited or restricted Self Care Goal Status (B9390): At least 1 percent but less than 20 percent impaired, limited or restricted Self Care Discharge Status [phone]): At least 1 percent but less than 20 percent impaired, limited or restricted   Mel Almond A. Ulice Brilliant, M.S., OTR/L Pager: [place] 11/03/2016, 4:35 PM

## 2016-11-03 NOTE — Progress Notes (Signed)
Physical Therapy Treatment Patient Details Name: Thomas Clark MRN: [id number] DOB: [birth year] Today's Date: 11/03/2016    History of Present Illness Pt is a 56 y/o male s/p elective R unicompartmental knee replacement. PMH includes obesity, HTN, and L unicompartmental knee replacement.     PT Comments    Pt performed increased gait and functional mobility.  Pt tolerated tx well with no complaints of dizziness.  Pt will benefit from continued rehab in home environment to continue to improve function and knee ROM.     Follow Up Recommendations  DC plan and follow up therapy as arranged by surgeon;Supervision for mobility/OOB     Equipment Recommendations       Recommendations for Other Services       Precautions / Restrictions Precautions Precautions: Knee Precaution Booklet Issued: Yes (comment) Precaution Comments: Reviewed supine ther ex with pt.  Restrictions Weight Bearing Restrictions: Yes RLE Weight Bearing: Weight bearing as tolerated    Mobility  Bed Mobility Overal bed mobility: Needs Assistance Bed Mobility: Supine to Sit;Sit to Supine     Supine to sit: Min assist Sit to supine: Min assist   General bed mobility comments: Pt required min assist for RLE advancement into and out of bed.    Transfers Overall transfer level: Needs assistance Equipment used: Rolling walker (2 wheeled) Transfers: Sit to/from Stand Sit to Stand: Min guard         General transfer comment: Cues for hand and foot placement to control descent to seated surface.  Ambulation/Gait Ambulation/Gait assistance: Min guard Ambulation Distance (Feet): 250 Feet Assistive device: Rolling walker (2 wheeled) Gait Pattern/deviations: Decreased step length - right;Decreased step length - left;Decreased weight shift to right;Antalgic;Step-through pattern Gait velocity: Decreased Gait velocity interpretation: Below normal speed for age/gender General Gait Details: Cues for upper trunk  control.  Cues for increasing step length on L.  Cues for heel strike and knee extension in stance phase on R.     Stairs            Wheelchair Mobility    Modified Rankin (Stroke Patients Only)       Balance Overall balance assessment: Needs assistance   Sitting balance-Leahy Scale: Good     Standing balance support: Bilateral upper extremity supported;During functional activity Standing balance-Leahy Scale: Poor Standing balance comment: Reliant on RW for stability                             Cognition Arousal/Alertness: Awake/alert Behavior During Therapy: WFL for tasks assessed/performed Overall Cognitive Status: Within Functional Limits for tasks assessed                                        Exercises Total Joint Exercises Ankle Circles/Pumps: AROM;Both;20 reps;Supine Quad Sets: AROM;Right;10 reps;Supine Towel Squeeze: AROM;Both;10 reps;Supine Short Arc Quad: AROM;Right;10 reps;Supine Heel Slides: AROM;Right;10 reps;Supine Hip ABduction/ADduction: AROM;Right;10 reps;Supine Straight Leg Raises: AAROM;Right;10 reps;Supine Goniometric ROM: 7-70 degrees R knee flexion.      General Comments        Pertinent Vitals/Pain Pain Assessment: 0-10 Pain Score: 8  Pain Location: R knee  Pain Descriptors / Indicators: Aching;Constant;Operative site guarding Pain Intervention(s): Limited activity within patient's tolerance;Monitored during session;Repositioned;Patient requesting pain meds-RN notified;Ice applied    Home Living  Prior Function            PT Goals (current goals can now be found in the care plan section) Acute Rehab PT Goals Patient Stated Goal: to go home  Potential to Achieve Goals: Good Progress towards PT goals: Progressing toward goals    Frequency    7X/week      PT Plan Current plan remains appropriate    Co-evaluation              AM-PAC PT "6 Clicks" Daily  Activity  Outcome Measure  Difficulty turning over in bed (including adjusting bedclothes, sheets and blankets)?: Unable Difficulty moving from lying on back to sitting on the side of the bed? : Unable Difficulty sitting down on and standing up from a chair with arms (e.g., wheelchair, bedside commode, etc,.)?: Unable Help needed moving to and from a bed to chair (including a wheelchair)?: A Little Help needed walking in hospital room?: A Little Help needed climbing 3-5 steps with a railing? : A Little 6 Click Score: 12    End of Session Equipment Utilized During Treatment: Gait belt;Right knee immobilizer Activity Tolerance: Patient tolerated treatment well Patient left: in chair;with call bell/phone within reach;with family/visitor present Nurse Communication: Mobility status;Patient requests pain meds PT Visit Diagnosis: Other abnormalities of gait and mobility (R26.89);Pain Pain - Right/Left: Right Pain - part of body: Knee     Time: 8469-6295 PT Time Calculation (min) (ACUTE ONLY): 33 min  Charges:  $Gait Training: 8-22 mins $Therapeutic Exercise: 8-22 mins                    G Codes:       Governor Rooks, PTA pager [phone]    Cristela Blue 11/03/2016, 1:28 PM

## 2016-11-03 NOTE — Progress Notes (Signed)
Patient ID: Thomas Clark, male   DOB: Jun 17, 1960, 56 y.o.   MRN: [id number]     Subjective:  Patient reports pain as mild.  Patient in the chair and in no acute distress.  Patient wants to go home  Objective:   VITALS:   Vitals:   11/02/16 1630 11/02/16 1645 11/02/16 1953 11/03/16 0219  BP:  134/65 (!) 101/46 118/73  Pulse: 91 86 100 81  Resp:  16 16 18   Temp:  98.2 F (36.8 C) 97.8 F (36.6 C) 98.2 F (36.8 C)  TempSrc:  Oral Oral Oral  SpO2: 97% 97% 95% 96%  Weight:  (!) 156.2 kg (344 lb 5.7 oz)    Height:  5\' 10"  (1.778 m)      ABD soft Sensation intact distally Dorsiflexion/Plantar flexion intact Incision: dressing C/D/I and no drainage   Lab Results  Component Value Date   WBC 11.2 (H) 11/03/2016   HGB 12.6 (L) 11/03/2016   HCT 37.9 (L) 11/03/2016   MCV 88.6 11/03/2016   PLT 199 11/03/2016   BMET    Component Value Date/Time   NA 137 11/03/2016 0353   K 3.2 (L) 11/03/2016 0353   CL 100 (L) 11/03/2016 0353   CO2 29 11/03/2016 0353   GLUCOSE 159 (H) 11/03/2016 0353   BUN 10 11/03/2016 0353   CREATININE 0.93 11/03/2016 0353   CALCIUM 8.6 (L) 11/03/2016 0353   GFRNONAA >60 11/03/2016 0353   GFRAA >60 11/03/2016 0353     Assessment/Plan: 1 Day Post-Op   Principal Problem:   Primary localized osteoarthritis of right knee Active Problems:   Severe obesity (BMI >= 40) (HCC)   S/P knee replacement   Advance diet Up with therapy WBAT Dry dressing PRN May DC home if cleared with PT   Rande Brunt, Erlene Quan 11/03/2016, 7:48 AM  Seen and agree with above.  DC home if passes PT.  Marchia Bond, MD Cell [phone]

## 2016-11-04 DIAGNOSIS — M1711 Unilateral primary osteoarthritis, right knee: Secondary | ICD-10-CM | POA: Diagnosis not present

## 2016-11-04 LAB — CBC
HCT: 39.9 % (ref 39.0–52.0)
HEMOGLOBIN: 13.4 g/dL (ref 13.0–17.0)
MCH: 30 pg (ref 26.0–34.0)
MCHC: 33.6 g/dL (ref 30.0–36.0)
MCV: 89.3 fL (ref 78.0–100.0)
Platelets: 226 10*3/uL (ref 150–400)
RBC: 4.47 MIL/uL (ref 4.22–5.81)
RDW: 13.2 % (ref 11.5–15.5)
WBC: 9.8 10*3/uL (ref 4.0–10.5)

## 2016-11-04 NOTE — Progress Notes (Addendum)
Physical Therapy Treatment Patient Details Name: Thomas Clark MRN: [id number] DOB: [birth year] Today's Date: 11/04/2016    History of Present Illness Pt is a 56 y/o male s/p elective R unicompartmental knee replacement. PMH includes obesity, HTN, and L unicompartmental knee replacement.     PT Comments    Pt performed increased mobility during session. Pt reviewed gait and HEP this am.  Plan this pm for return home.     Follow Up Recommendations  DC plan and follow up therapy as arranged by surgeon;Supervision for mobility/OOB     Equipment Recommendations  None recommended by PT    Recommendations for Other Services       Precautions / Restrictions Precautions Precautions: Knee Precaution Booklet Issued: Yes (comment) Precaution Comments: Reviewed supine ther ex with pt.  Restrictions Weight Bearing Restrictions: Yes RLE Weight Bearing: Weight bearing as tolerated    Mobility  Bed Mobility Overal bed mobility: Needs Assistance       Supine to sit: Supervision Sit to supine: Supervision   General bed mobility comments: Pt performed without assistance with max VCs for problem solving to improve independence.    Transfers Overall transfer level: Needs assistance Equipment used: Rolling walker (2 wheeled) Transfers: Sit to/from Stand Sit to Stand: Supervision         General transfer comment: for safety, good hand placement and technique  Ambulation/Gait Ambulation/Gait assistance: Supervision Ambulation Distance (Feet): 300 Feet Assistive device: Rolling walker (2 wheeled) Gait Pattern/deviations: Decreased step length - right;Decreased step length - left;Decreased weight shift to right;Antalgic;Step-through pattern Gait velocity: Decreased Gait velocity interpretation: Below normal speed for age/gender General Gait Details: Cues for upper trunk control.  Cues for increasing step length on L.  Cues for heel strike and knee extension in stance phase on R.      Stairs            Wheelchair Mobility    Modified Rankin (Stroke Patients Only)       Balance Overall balance assessment: Needs assistance   Sitting balance-Leahy Scale: Good       Standing balance-Leahy Scale: Poor                              Cognition Arousal/Alertness: Awake/alert Behavior During Therapy: WFL for tasks assessed/performed Overall Cognitive Status: Within Functional Limits for tasks assessed                                        Exercises Total Joint Exercises Ankle Circles/Pumps: AROM;Both;20 reps;Supine Quad Sets: AROM;Right;10 reps;Supine Heel Slides: AROM;Right;10 reps;Supine Hip ABduction/ADduction: AROM;Right;10 reps;Supine Straight Leg Raises: AAROM;Right;10 reps;Supine 75 degrees flexion in R knee.     General Comments        Pertinent Vitals/Pain Pain Assessment: 0-10 Pain Score: 6  Pain Location: R knee Pain Descriptors / Indicators: Aching;Sore Pain Intervention(s): Monitored during session;Repositioned;Ice applied    Home Living                      Prior Function            PT Goals (current goals can now be found in the care plan section) Acute Rehab PT Goals Patient Stated Goal: to go home  Potential to Achieve Goals: Good Progress towards PT goals: Progressing toward goals    Frequency    7X/week  PT Plan Current plan remains appropriate    Co-evaluation              AM-PAC PT "6 Clicks" Daily Activity  Outcome Measure  Difficulty turning over in bed (including adjusting bedclothes, sheets and blankets)?: A Lot Difficulty moving from lying on back to sitting on the side of the bed? : A Lot Difficulty sitting down on and standing up from a chair with arms (e.g., wheelchair, bedside commode, etc,.)?: A Lot Help needed moving to and from a bed to chair (including a wheelchair)?: A Little Help needed walking in hospital room?: A Little Help needed  climbing 3-5 steps with a railing? : A Little 6 Click Score: 15    End of Session Equipment Utilized During Treatment: Gait belt Activity Tolerance: Patient tolerated treatment well Patient left: in chair;with call bell/phone within reach;with family/visitor present Nurse Communication: Mobility status PT Visit Diagnosis: Other abnormalities of gait and mobility (R26.89);Pain Pain - Right/Left: Right Pain - part of body: Knee     Time: 0950-1016 PT Time Calculation (min) (ACUTE ONLY): 26 min  Charges:  $Gait Training: 8-22 mins $Therapeutic Exercise: 8-22 mins                    G Codes:       Governor Rooks, PTA pager [phone]    Cristela Blue 11/04/2016, 4:35 PM

## 2016-11-04 NOTE — Care Management Note (Signed)
Case Management Note  Patient Details  Name: Thomas Clark MRN: [id number] Date of Birth: 1960/06/24  Subjective/Objective:   56 yr old gentleman s/p right unicompartmental knee arthroplasty.                 Action/Plan: Patient was preoperatively setup with [facility], no changes. Will have family support at discharge.    Expected Discharge Date:  11/04/16               Expected Discharge Plan:  [place]  In-House Referral:  NA  Discharge planning Services  CM Consult  Post Acute Care Choice:  Home Health Choice offered to:  Patient  DME Arranged:   (has RW and 3in1) DME Agency:  [employer]  HH Arranged:  PT HH Agency:  [facility] (formerly [employer])  Status of Service:  Completed, signed off  If discussed at [employer], dates discussed:    Additional Comments:  Ninfa Meeker, RN 11/04/2016, 12:34 PM

## 2016-11-04 NOTE — Progress Notes (Signed)
Patient ID: Thomas Clark, male   DOB: 1960/09/05, 56 y.o.   MRN: [id number]     Subjective:  Patient reports pain as mild to moderate.  Patient in bed and reports much improvement over yesterday.  Objective:   VITALS:   Vitals:   11/03/16 0219 11/03/16 0715 11/03/16 2100 11/04/16 0500  BP: 118/73 111/68 (!) 142/82 (!) 161/83  Pulse: 81 86 86 71  Resp: 18 16 18 20   Temp: 98.2 F (36.8 C) 98.3 F (36.8 C) 98.5 F (36.9 C) 98.2 F (36.8 C)  TempSrc: Oral Oral Oral Oral  SpO2: 96% 98% 95% 99%  Weight:      Height:        ABD soft Sensation intact distally Dorsiflexion/Plantar flexion intact Incision: dressing C/D/I and no drainage   Lab Results  Component Value Date   WBC 9.8 11/04/2016   HGB 13.4 11/04/2016   HCT 39.9 11/04/2016   MCV 89.3 11/04/2016   PLT 226 11/04/2016   BMET    Component Value Date/Time   NA 137 11/03/2016 0353   K 3.2 (L) 11/03/2016 0353   CL 100 (L) 11/03/2016 0353   CO2 29 11/03/2016 0353   GLUCOSE 159 (H) 11/03/2016 0353   BUN 10 11/03/2016 0353   CREATININE 0.93 11/03/2016 0353   CALCIUM 8.6 (L) 11/03/2016 0353   GFRNONAA >60 11/03/2016 0353   GFRAA >60 11/03/2016 0353     Assessment/Plan: 2 Days Post-Op   Principal Problem:   Primary localized osteoarthritis of right knee Active Problems:   Severe obesity (BMI >= 40) (HCC)   S/P knee replacement   Advance diet Up with therapy Discharge home with home health WBAT Dry dressing PRN Follow up with Dr Mardelle Matte as scheduled    Rande Brunt, [place] 11/04/2016, 6:50 AM  Discussed and agree with above.   Marchia Bond, MD Cell [phone]

## 2016-11-04 NOTE — Discharge Summary (Signed)
Physician Discharge Summary  Patient ID: [name] MRN: [id number] DOB/AGE: [birth year] 56 y.o.  Admit date: 11/02/2016 Discharge date: 11/04/2016  Admission Diagnoses:  Primary localized osteoarthritis of right knee  Discharge Diagnoses:  Principal Problem:   Primary localized osteoarthritis of right knee Active Problems:   Severe obesity (BMI >= 40) (HCC)   S/P knee replacement   Past Medical History:  Diagnosis Date  . Ankle fracture 1974   "put a cast on it"  . Arthritis    "feet, legs" (03/26/2016)  . High cholesterol   . Hypertension   . Obesity   . Primary localized osteoarthritis of left knee 03/26/2016  . Primary localized osteoarthritis of right knee 11/02/2016  . Severe obesity (BMI >= 40) ([place]) 03/26/2016  . Umbilical hernia     Surgeries: Procedure(s): UNICOMPARTMENTAL RIGHT KNEE on 11/02/2016   Consultants (if any):   Discharged Condition: Improved  Hospital Course: Thomas Clark is an 56 y.o. male who was admitted 11/02/2016 with a diagnosis of Primary localized osteoarthritis of right knee and went to the operating room on 11/02/2016 and underwent the above named procedures.    He was given perioperative antibiotics:  Anti-infectives    Start     Dose/Rate Route Frequency Ordered Stop   11/02/16 1730  ceFAZolin (ANCEF) IVPB 2g/100 mL premix     2 g 200 mL/hr over 30 Minutes Intravenous Every 6 hours 11/02/16 1420 11/02/16 2308   11/02/16 0600  ceFAZolin (ANCEF) 3 g in dextrose 5 % 50 mL IVPB     3 g 130 mL/hr over 30 Minutes Intravenous On call to O.R. 10/31/16 1441 11/02/16 1111    .  He was given sequential compression devices, early ambulation, and xarelto for DVT prophylaxis.  He benefited maximally from the hospital stay and there were no complications.  He was kept overnight on postoperative day one for a second night because he was requiring IV pain medication, I added Valium to his regimen along with increasing his IV Dilaudid dose to 1.0 mg.  Ultimately we were able to get in front of his pain control and was well controlled on oral medications.  Recent vital signs:  Vitals:   11/03/16 2100 11/04/16 0500  BP: (!) 142/82 (!) 161/83  Pulse: 86 71  Resp: 18 20  Temp: 98.5 F (36.9 C) 98.2 F (36.8 C)  SpO2: 95% 99%    Recent laboratory studies:  Lab Results  Component Value Date   HGB 13.4 11/04/2016   HGB 12.6 (L) 11/03/2016   HGB 13.6 10/22/2016   Lab Results  Component Value Date   WBC 9.8 11/04/2016   PLT 226 11/04/2016   No results found for: INR Lab Results  Component Value Date   NA 137 11/03/2016   K 3.2 (L) 11/03/2016   CL 100 (L) 11/03/2016   CO2 29 11/03/2016   BUN 10 11/03/2016   CREATININE 0.93 11/03/2016   GLUCOSE 159 (H) 11/03/2016    Discharge Medications:   Allergies as of 11/04/2016      Reactions   Aleve [naproxen Sodium] Other (See Comments)   GI bleeds      Medication List    STOP taking these medications   acetaminophen 500 MG tablet Commonly known as:  TYLENOL     TAKE these medications   BENEFIBER DRINK MIX Pack Take 1 Dose by mouth 1 day or 1 dose.   celecoxib 200 MG capsule Commonly known as:  CELEBREX Take 200 mg by  mouth daily.   cetirizine 10 MG tablet Commonly known as:  ZYRTEC Take 10 mg by mouth daily.   [employer] EASY FIBER/CALCIUM Chew Chew 2 tablets by mouth 2 (two) times daily.   DULoxetine 60 MG capsule Commonly known as:  CYMBALTA Take 60 mg by mouth at bedtime.   lisinopril-hydrochlorothiazide 20-12.5 MG tablet Commonly known as:  PRINZIDE,ZESTORETIC Take 1 tablet by mouth 2 (two) times daily.   methocarbamol 500 MG tablet Commonly known as:  ROBAXIN Take 1 tablet (500 mg total) by mouth every 8 (eight) hours as needed for muscle spasms.   multivitamin with minerals Tabs tablet Take 1 tablet by mouth 2 (two) times daily.   ondansetron 4 MG tablet Commonly known as:  ZOFRAN Take 1 tablet (4 mg total) by mouth every 8 (eight) hours as needed for  nausea or vomiting.   oxyCODONE-acetaminophen 10-325 MG tablet Commonly known as:  PERCOCET Take 1-2 tablets by mouth every 6 (six) hours as needed for pain. MAXIMUM TOTAL ACETAMINOPHEN DOSE IS 4000 MG PER DAY   rivaroxaban 10 MG Tabs tablet Commonly known as:  XARELTO Take 1 tablet (10 mg total) by mouth daily.   sennosides-docusate sodium 8.6-50 MG tablet Commonly known as:  SENOKOT-S Take 2 tablets by mouth daily.   simvastatin 40 MG tablet Commonly known as:  ZOCOR Take 40 mg by mouth daily at 6 PM.            Discharge Care Instructions        Start     Ordered   11/02/16 0000  methocarbamol (ROBAXIN) 500 MG tablet  Every 8 hours PRN     11/02/16 1126   11/02/16 0000  oxyCODONE-acetaminophen (PERCOCET) 10-325 MG tablet  Every 6 hours PRN     11/02/16 1126   11/02/16 0000  ondansetron (ZOFRAN) 4 MG tablet  Every 8 hours PRN     11/02/16 1126   11/02/16 0000  rivaroxaban (XARELTO) 10 MG TABS tablet  Daily     11/02/16 1126      Diagnostic Studies: Dg Knee Right Port  Result Date: 11/02/2016 CLINICAL DATA:  Postop right knee surgery EXAM: PORTABLE RIGHT KNEE - 1-2 VIEW COMPARISON:  None. FINDINGS: Evidence of medial compartment hemiarthroplasty. No hardware bony complicating feature. Large joint effusion. Soft tissue and joint space gas noted. IMPRESSION: Medial compartment hemiarthroplasty with joint effusion and joint space gas. No complicating feature. Electronically Signed   By: Rolm Baptise M.D.   On: 11/02/2016 15:00    Disposition: 01-Home or Self Care    Follow-up Information    Thomas Bond, MD. Schedule an appointment as soon as possible for a visit in 2 weeks.   Specialty:  Orthopedic Surgery Contact information: [address] [place] [address] [phone]            Signed: [name] 11/04/2016, 7:53 AM

## 2018-05-18 IMAGING — DX DG KNEE 1-2V PORT*R*
2 series · 2 of 2 positions shown · non-contrast
Comparison: None.

CLINICAL DATA: Postop right knee surgery

EXAM:
PORTABLE RIGHT KNEE - 1-2 VIEW

[knee ap]
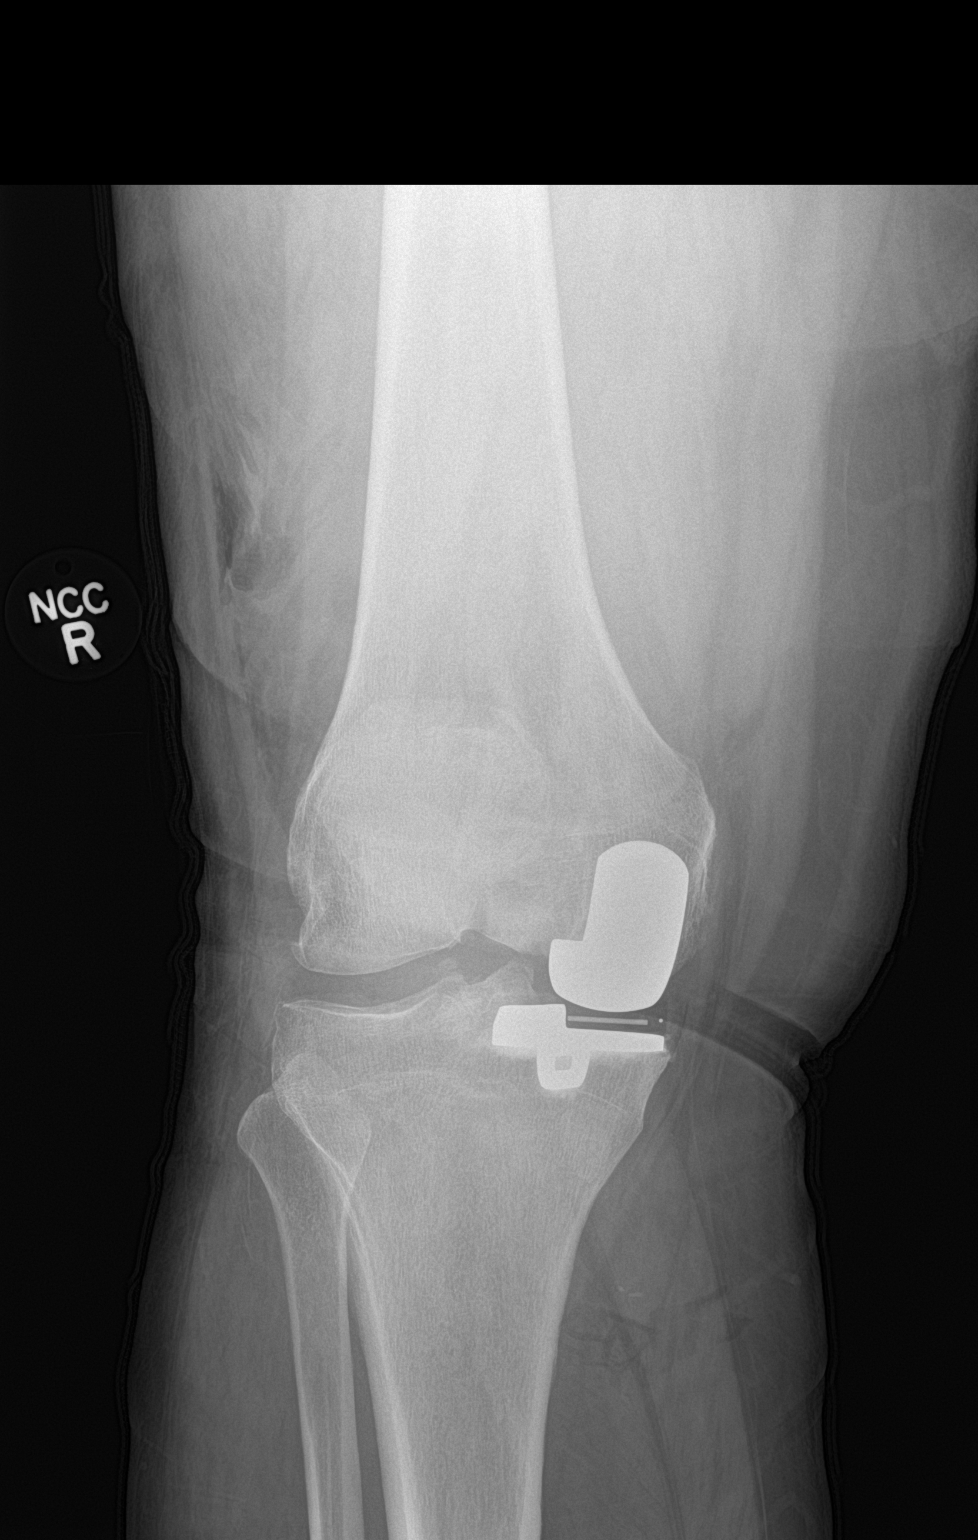

[knee lat]
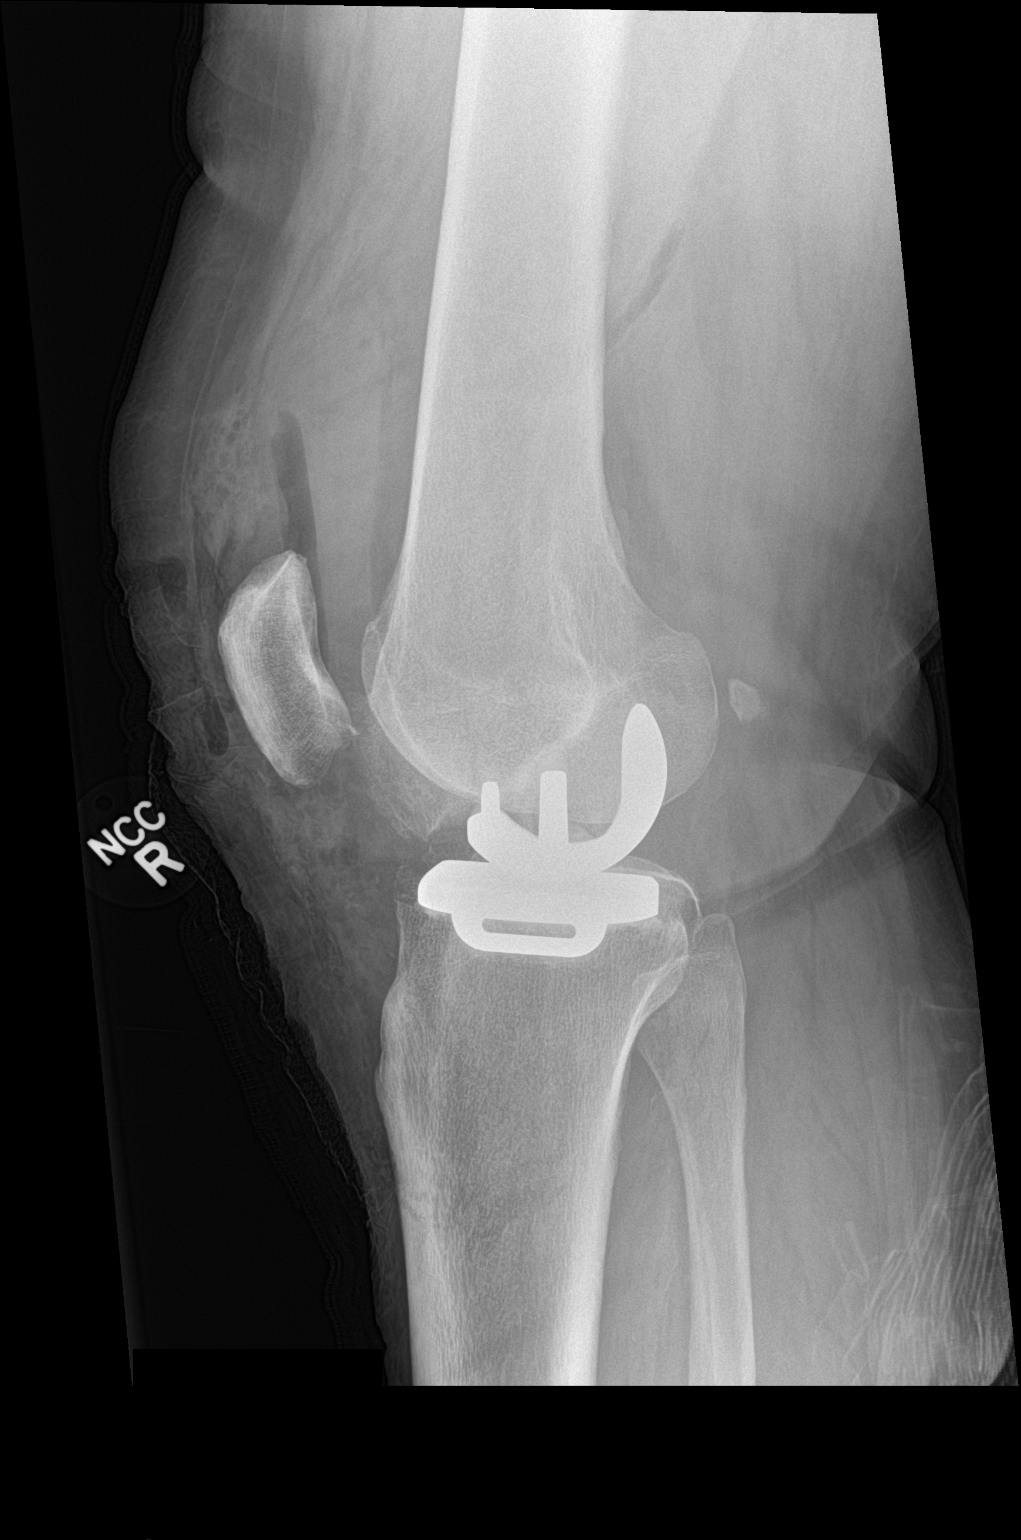

[2 of 2 positions shown; findings below may reference images not displayed]

FINDINGS: Evidence of medial compartment hemiarthroplasty. No hardware bony
complicating feature. Large joint effusion. Soft tissue and joint
space gas noted.
IMPRESSION: Medial compartment hemiarthroplasty with joint effusion and joint
space gas. No complicating feature.

## 2019-12-24 ENCOUNTER — Other Ambulatory Visit: Payer: Self-pay | Admitting: Orthopedic Surgery

## 2020-01-01 ENCOUNTER — Encounter (HOSPITAL_BASED_OUTPATIENT_CLINIC_OR_DEPARTMENT_OTHER): Payer: Self-pay | Admitting: Orthopedic Surgery

## 2020-01-01 ENCOUNTER — Other Ambulatory Visit: Payer: Self-pay

## 2020-01-01 NOTE — Progress Notes (Signed)
Reviewed chart with Dr. Sabra Heck at [facility].  Pt will come in for an anesthesia consult for BMI check and blood work before [employer].

## 2020-01-04 ENCOUNTER — Encounter (HOSPITAL_BASED_OUTPATIENT_CLINIC_OR_DEPARTMENT_OTHER)
Admission: RE | Admit: 2020-01-04 | Discharge: 2020-01-04 | Disposition: A | Discharge status: Home / Self Care | Payer: BC Managed Care – PPO | Source: Ambulatory Visit | Attending: Orthopedic Surgery | Admitting: Orthopedic Surgery

## 2020-01-04 ENCOUNTER — Other Ambulatory Visit: Payer: Self-pay

## 2020-01-04 ENCOUNTER — Other Ambulatory Visit (HOSPITAL_COMMUNITY)
Admission: RE | Admit: 2020-01-04 | Discharge: 2020-01-04 | Disposition: A | Discharge status: Home / Self Care | Payer: BC Managed Care – PPO | Source: Ambulatory Visit | Attending: Orthopedic Surgery | Admitting: Orthopedic Surgery

## 2020-01-04 DIAGNOSIS — Z01812 Encounter for preprocedural laboratory examination: Secondary | ICD-10-CM | POA: Insufficient documentation

## 2020-01-04 DIAGNOSIS — Z20822 Contact with and (suspected) exposure to covid-19: Secondary | ICD-10-CM | POA: Diagnosis not present

## 2020-01-04 LAB — BASIC METABOLIC PANEL
Anion gap: 9 (ref 5–15)
BUN: 14 mg/dL (ref 8–23)
CO2: 30 mmol/L (ref 22–32)
Calcium: 9.2 mg/dL (ref 8.9–10.3)
Chloride: 100 mmol/L (ref 98–111)
Creatinine, Ser: 0.87 mg/dL (ref 0.61–1.24)
GFR, Estimated: >60 mL/min (ref >60)
Glucose, Bld: 88 mg/dL (ref 70–99)
Potassium: 4.4 mmol/L (ref 3.5–5.1)
Sodium: 139 mmol/L (ref 135–145)

## 2020-01-04 LAB — SARS CORONAVIRUS 2 (TAT 6-24 HRS): SARS Coronavirus 2: NEGATIVE

## 2020-01-04 NOTE — Progress Notes (Signed)

## 2020-01-04 NOTE — Progress Notes (Signed)
Anesthesia consult with Dr Tobias Alexander. Ok to proceed as planned

## 2020-01-08 ENCOUNTER — Encounter (HOSPITAL_BASED_OUTPATIENT_CLINIC_OR_DEPARTMENT_OTHER)
Admission: RE | Disposition: A | Discharge status: Home / Self Care | Payer: Self-pay | Source: Home / Self Care | Attending: Orthopedic Surgery

## 2020-01-08 ENCOUNTER — Encounter (HOSPITAL_BASED_OUTPATIENT_CLINIC_OR_DEPARTMENT_OTHER): Payer: Self-pay | Admitting: Orthopedic Surgery

## 2020-01-08 ENCOUNTER — Ambulatory Visit (HOSPITAL_BASED_OUTPATIENT_CLINIC_OR_DEPARTMENT_OTHER)
Admission: RE | Admit: 2020-01-08 | Discharge: 2020-01-08 | Disposition: A | Discharge status: Home / Self Care | Payer: BC Managed Care – PPO | Attending: Orthopedic Surgery | Admitting: Orthopedic Surgery

## 2020-01-08 ENCOUNTER — Ambulatory Visit (HOSPITAL_BASED_OUTPATIENT_CLINIC_OR_DEPARTMENT_OTHER): Payer: BC Managed Care – PPO | Admitting: Anesthesiology

## 2020-01-08 ENCOUNTER — Other Ambulatory Visit: Payer: Self-pay

## 2020-01-08 DIAGNOSIS — F17290 Nicotine dependence, other tobacco product, uncomplicated: Secondary | ICD-10-CM | POA: Insufficient documentation

## 2020-01-08 DIAGNOSIS — Z886 Allergy status to analgesic agent status: Secondary | ICD-10-CM | POA: Diagnosis not present

## 2020-01-08 DIAGNOSIS — I10 Essential (primary) hypertension: Secondary | ICD-10-CM | POA: Insufficient documentation

## 2020-01-08 DIAGNOSIS — E7800 Pure hypercholesterolemia, unspecified: Secondary | ICD-10-CM | POA: Insufficient documentation

## 2020-01-08 DIAGNOSIS — Z6841 Body Mass Index (BMI) 40.0 and over, adult: Secondary | ICD-10-CM | POA: Diagnosis not present

## 2020-01-08 DIAGNOSIS — G5603 Carpal tunnel syndrome, bilateral upper limbs: Secondary | ICD-10-CM | POA: Diagnosis not present

## 2020-01-08 HISTORY — DX: Sleep apnea, unspecified: G47.30

## 2020-01-08 HISTORY — PX: CARPAL TUNNEL RELEASE: SHX101

## 2020-01-08 SURGERY — CARPAL TUNNEL RELEASE
Anesthesia: Regional | Site: Wrist | Laterality: Left

## 2020-01-08 MED ORDER — LIDOCAINE HCL (PF) 0.5 % IJ SOLN
INTRAMUSCULAR | Status: DC | PRN
  Administered 2020-01-08: 30 mL via INTRAVENOUS

## 2020-01-08 MED ORDER — CEFAZOLIN SODIUM-DEXTROSE 2-4 GM/100ML-% IV SOLN
INTRAVENOUS | Status: AC
  Filled 2020-01-08: qty 100

## 2020-01-08 MED ORDER — PROPOFOL 500 MG/50ML IV EMUL
INTRAVENOUS | Status: DC | PRN
  Administered 2020-01-08: 50 ug/kg/min via INTRAVENOUS

## 2020-01-08 MED ORDER — FENTANYL CITRATE (PF) 100 MCG/2ML IJ SOLN
INTRAMUSCULAR | Status: AC
  Filled 2020-01-08: qty 2

## 2020-01-08 MED ORDER — HYDROMORPHONE HCL 1 MG/ML IJ SOLN
INTRAMUSCULAR | Status: AC
  Filled 2020-01-08: qty 0.5

## 2020-01-08 MED ORDER — OXYCODONE HCL 5 MG/5ML PO SOLN: 5.0000 mg | Freq: Once | ORAL | Status: DC | PRN

## 2020-01-08 MED ORDER — CEFAZOLIN SODIUM-DEXTROSE 2-4 GM/100ML-% IV SOLN
2.0000 g | INTRAVENOUS | Status: AC
  Administered 2020-01-08: 2 g via INTRAVENOUS

## 2020-01-08 MED ORDER — HYDROCODONE-ACETAMINOPHEN 5-325 MG PO TABS: 1.0000 | ORAL_TABLET | Freq: Four times a day (QID) | ORAL | 0 refills | Status: DC | PRN

## 2020-01-08 MED ORDER — FENTANYL CITRATE (PF) 100 MCG/2ML IJ SOLN
INTRAMUSCULAR | Status: DC | PRN
  Administered 2020-01-08 (×2): 25 ug via INTRAVENOUS
  Administered 2020-01-08: 50 ug via INTRAVENOUS

## 2020-01-08 MED ORDER — PROPOFOL 10 MG/ML IV BOLUS
INTRAVENOUS | Status: DC | PRN
  Administered 2020-01-08 (×2): 20 mg via INTRAVENOUS

## 2020-01-08 MED ORDER — LACTATED RINGERS IV SOLN: INTRAVENOUS | Status: DC

## 2020-01-08 MED ORDER — TRAMADOL HCL 50 MG PO TABS
50.0000 mg | ORAL_TABLET | Freq: Four times a day (QID) | ORAL | 0 refills | Status: DC | PRN
Start: 2020-01-08 — End: 2020-01-08

## 2020-01-08 MED ORDER — MIDAZOLAM HCL 2 MG/2ML IJ SOLN
INTRAMUSCULAR | Status: AC
  Filled 2020-01-08: qty 2

## 2020-01-08 MED ORDER — MIDAZOLAM HCL 5 MG/5ML IJ SOLN
INTRAMUSCULAR | Status: DC | PRN
  Administered 2020-01-08 (×2): 1 mg via INTRAVENOUS

## 2020-01-08 MED ORDER — CEFAZOLIN SODIUM-DEXTROSE 1-4 GM/50ML-% IV SOLN
INTRAVENOUS | Status: AC
  Filled 2020-01-08: qty 50

## 2020-01-08 MED ORDER — ONDANSETRON HCL 4 MG/2ML IJ SOLN
INTRAMUSCULAR | Status: DC | PRN
  Administered 2020-01-08: 4 mg via INTRAVENOUS

## 2020-01-08 MED ORDER — HYDROMORPHONE HCL 1 MG/ML IJ SOLN
0.2500 mg | INTRAMUSCULAR | Status: DC | PRN
  Administered 2020-01-08: 0.25 mg via INTRAVENOUS
  Administered 2020-01-08: 0.5 mg via INTRAVENOUS

## 2020-01-08 MED ORDER — BUPIVACAINE HCL (PF) 0.25 % IJ SOLN
INTRAMUSCULAR | Status: DC | PRN
  Administered 2020-01-08: 10 mL

## 2020-01-08 MED ORDER — PROMETHAZINE HCL 25 MG/ML IJ SOLN: 6.2500 mg | INTRAMUSCULAR | Status: DC | PRN

## 2020-01-08 MED ORDER — OXYCODONE HCL 5 MG PO TABS: 5.0000 mg | ORAL_TABLET | Freq: Once | ORAL | Status: DC | PRN

## 2020-01-08 SURGICAL SUPPLY — 38 items
APL PRP STRL LF DISP 70% ISPRP (MISCELLANEOUS) ×1
BLADE SURG 15 STRL LF DISP TIS (BLADE) ×1 IMPLANT
BLADE SURG 15 STRL SS (BLADE) ×3
BNDG CMPR 9X4 STRL LF SNTH (GAUZE/BANDAGES/DRESSINGS)
BNDG COHESIVE 3X5 TAN STRL LF (GAUZE/BANDAGES/DRESSINGS) ×3 IMPLANT
BNDG ESMARK 4X9 LF (GAUZE/BANDAGES/DRESSINGS) IMPLANT
BNDG GAUZE ELAST 4 BULKY (GAUZE/BANDAGES/DRESSINGS) ×3 IMPLANT
CHLORAPREP W/TINT 26 (MISCELLANEOUS) ×3 IMPLANT
CORD BIPOLAR FORCEPS 12FT (ELECTRODE) ×3 IMPLANT
COVER BACK TABLE 60X90IN (DRAPES) ×3 IMPLANT
COVER MAYO STAND STRL (DRAPES) ×3 IMPLANT
COVER WAND RF STERILE (DRAPES) IMPLANT
CUFF TOURN SGL QUICK 18X4 (TOURNIQUET CUFF) ×3 IMPLANT
DRAPE EXTREMITY T 121X128X90 (DISPOSABLE) ×3 IMPLANT
DRAPE SURG 17X23 STRL (DRAPES) ×3 IMPLANT
DRSG PAD ABDOMINAL 8X10 ST (GAUZE/BANDAGES/DRESSINGS) ×3 IMPLANT
GAUZE SPONGE 4X4 12PLY STRL (GAUZE/BANDAGES/DRESSINGS) ×3 IMPLANT
GAUZE XEROFORM 1X8 LF (GAUZE/BANDAGES/DRESSINGS) ×3 IMPLANT
GLOVE BIO SURGEON STRL SZ 6.5 (GLOVE) ×2 IMPLANT
GLOVE BIO SURGEONS STRL SZ 6.5 (GLOVE) ×1
GLOVE BIOGEL PI IND STRL 8.5 (GLOVE) ×1 IMPLANT
GLOVE BIOGEL PI INDICATOR 8.5 (GLOVE) ×2
GLOVE SURG ORTHO 8.0 STRL STRW (GLOVE) ×3 IMPLANT
GLOVE SURG SYN 7.5  E (GLOVE) ×2
GLOVE SURG SYN 7.5 E (GLOVE) ×1 IMPLANT
GOWN STRL REUS W/ TWL LRG LVL3 (GOWN DISPOSABLE) ×1 IMPLANT
GOWN STRL REUS W/TWL LRG LVL3 (GOWN DISPOSABLE) ×3
GOWN STRL REUS W/TWL XL LVL3 (GOWN DISPOSABLE) ×3 IMPLANT
NEEDLE PRECISIONGLIDE 27X1.5 (NEEDLE) IMPLANT
NS IRRIG 1000ML POUR BTL (IV SOLUTION) ×3 IMPLANT
PACK BASIN DAY SURGERY FS (CUSTOM PROCEDURE TRAY) ×3 IMPLANT
STOCKINETTE 4X48 STRL (DRAPES) ×3 IMPLANT
SUT ETHILON 4 0 PS 2 18 (SUTURE) ×3 IMPLANT
SUT VICRYL 4-0 PS2 18IN ABS (SUTURE) IMPLANT
SYR BULB EAR ULCER 3OZ GRN STR (SYRINGE) ×3 IMPLANT
SYR CONTROL 10ML LL (SYRINGE) IMPLANT
TOWEL GREEN STERILE FF (TOWEL DISPOSABLE) ×3 IMPLANT
UNDERPAD 30X36 HEAVY ABSORB (UNDERPADS AND DIAPERS) ×3 IMPLANT

## 2020-01-08 NOTE — H&P (Signed)
Thomas Clark is an 59 y.o. male.   Chief Complaint: numbness left hand JTT:SVXB is a 59 year old right-hand-dominant male referred by Dr. Fransisco Beau for consultation regarding numbness and tingling bilateral hands left greater than right. This involves thumb through ring fingers is been going approximately 9 months. He has no history of injury to the hand or to the neck. Is occasionally awakened at night. He has been wearing splints. He is presently taking Celebrex. He states that it gives him minimal relief. He states nothing seems to make it better or worse. He has had nerve conductions done revealing carpal tunnel syndrome bilaterally with a motor delay 4.8 on the left 4.3 on the right sensory delay of 2.4 on the left 2.5 on the right. He has no history of diabetes thyroid problems or gout. Does have a history of arthritis. Family history is similar.   He was placed on a Medrol Dosepak which gave him some relief on the right side. He had an injection to the carpal tunnel on his left side   Past Medical History:  Diagnosis Date  . Ankle fracture 1974   "put a cast on it"  . Arthritis    "feet, legs" (03/26/2016)  . High cholesterol   . Hypertension   . Obesity   . Primary localized osteoarthritis of left knee 03/26/2016  . Primary localized osteoarthritis of right knee 11/02/2016  . Severe obesity (BMI >= 40) ([place]) 03/26/2016  . Sleep apnea   . Umbilical hernia     Past Surgical History:  Procedure Laterality Date  . COLONOSCOPY W/ BIOPSIES AND POLYPECTOMY    . HERNIA REPAIR  1985   "stomach"  . PARTIAL KNEE ARTHROPLASTY Left 03/26/2016   Procedure: UNICOMPARTMENTAL KNEE ARTHROPLASTY;  Surgeon: Marchia Bond, MD;  Location: [facility];  Service: Orthopedics;  Laterality: Left;  . PARTIAL KNEE ARTHROPLASTY Right 11/02/2016   Procedure: UNICOMPARTMENTAL RIGHT KNEE;  Surgeon: Marchia Bond, MD;  Location: [facility];  Service: Orthopedics;  Laterality: Right;  . REPLACEMENT UNICONDYLAR JOINT KNEE  Left 03/26/2016    History reviewed. No pertinent family history. Social History:  reports that he has been smoking cigars. He has smoked for the past 22.00 years. He has never used smokeless tobacco. He reports current alcohol use. He reports that he does not use drugs.  Allergies:  Allergies  Allergen Reactions  . Aleve [Naproxen Sodium] Other (See Comments)    GI bleeds    No medications prior to admission.    No results found for this or any previous visit (from the past 48 hour(s)).  No results found.   Pertinent items are noted in HPI.  Height 5\' 10"  (1.778 m), weight (!) 166.4 kg.  General appearance: alert, cooperative and appears stated age Head: Normocephalic, without obvious abnormality Neck: no JVD Resp: clear to auscultation bilaterally Cardio: regular rate and rhythm, S1, S2 normal, no murmur, click, rub or gallop GI: soft, non-tender; bowel sounds normal; no masses,  no organomegaly Extremities: numbness left hand Pulses: 2+ and symmetric Skin: Skin color, texture, turgor normal. No rashes or lesions Neurologic: Grossly normal Incision/Wound: na  Assessment/Plan Assessment:  1. Bilateral carpal tunnel syndrome    Plan: He would like to proceed to have his left carpal tunnel release. Preperi-and postoperative course been discussed along with risk and complications. He is aware there is no guarantee to the surgery the possibility of infection recurrence injury to arteries nerves tendons complete relief symptoms and dystrophy. This will be scheduled as an  outpatient under regional anesthesia.   Daryll Brod 01/08/2020, 5:01 AM

## 2020-01-08 NOTE — Anesthesia Procedure Notes (Signed)
Procedure Name: MAC Date/Time: 01/08/2020 9:57 AM Performed by: Maryella Shivers, CRNA Pre-anesthesia Checklist: Patient identified, Emergency Drugs available, Suction available, Patient being monitored and Timeout performed Patient Re-evaluated:Patient Re-evaluated prior to induction Oxygen Delivery Method: Simple face mask

## 2020-01-08 NOTE — Anesthesia Preprocedure Evaluation (Signed)
Anesthesia Evaluation  Patient identified by MRN, date of birth, ID band Patient awake    Reviewed: Allergy & Precautions, H&P , NPO status , Patient's Chart, lab work & pertinent test results  Airway Mallampati: III  TM Distance: >3 FB Neck ROM: Full    Dental no notable dental hx. (+) Teeth Intact, Dental Advisory Given   Pulmonary neg pulmonary ROS, sleep apnea , Current Smoker and Patient abstained from smoking.,    Pulmonary exam normal breath sounds clear to auscultation       Cardiovascular hypertension, Pt. on medications negative cardio ROS   Rhythm:Regular Rate:Normal     Neuro/Psych negative neurological ROS  negative psych ROS   GI/Hepatic negative GI ROS, Neg liver ROS,   Endo/Other  negative endocrine ROSMorbid obesity  Renal/GU negative Renal ROS  negative genitourinary   Musculoskeletal negative musculoskeletal ROS (+) Arthritis , Osteoarthritis,    Abdominal (+) + obese,   Peds negative pediatric ROS (+)  Hematology negative hematology ROS (+)   Anesthesia Other Findings   Reproductive/Obstetrics negative OB ROS                             Anesthesia Physical  Anesthesia Plan  ASA: III  Anesthesia Plan: Bier Block and Bier Block-LIDOCAINE ONLY   Post-op Pain Management:    Induction: Intravenous  PONV Risk Score and Plan: 0 and Treatment may vary due to age or medical condition  Airway Management Planned: Simple Face Mask  Additional Equipment:   Intra-op Plan:   Post-operative Plan:   Informed Consent: I have reviewed the patients History and Physical, chart, labs and discussed the procedure including the risks, benefits and alternatives for the proposed anesthesia with the patient or authorized representative who has indicated his/her understanding and acceptance.     Dental advisory given  Plan Discussed with: CRNA  Anesthesia Plan Comments:          Anesthesia Quick Evaluation

## 2020-01-08 NOTE — Transfer of Care (Signed)
Immediate Anesthesia Transfer of Care Note  Patient: Thomas Clark  Procedure(s) Performed: CARPAL TUNNEL RELEASE (Left Wrist)  Patient Location: PACU  Anesthesia Type:MAC and Bier block  Level of Consciousness: awake, alert  and oriented  Airway & Oxygen Therapy: Patient Spontanous Breathing and Patient connected to face mask oxygen  Post-op Assessment: Report given to RN and Post -op Vital signs reviewed and stable  Post vital signs: Reviewed and stable  Last Vitals:  Vitals Value Taken Time  BP 151/91 01/08/20 1030  Temp    Pulse 69 01/08/20 1031  Resp 21 01/08/20 1031  SpO2 100 % 01/08/20 1031  Vitals shown include unvalidated device data.  Last Pain:  Vitals:   01/08/20 0757  TempSrc: Oral  PainSc: 4          Complications: No complications documented.

## 2020-01-08 NOTE — Anesthesia Procedure Notes (Signed)
Anesthesia Regional Block: Bier block (IV Regional)   Pre-Anesthetic Checklist: ,, timeout performed, Correct Patient, Correct Site, Correct Laterality, Correct Procedure,, site marked, surgical consent,, at surgeon's request  Laterality: Left     Needles:  Injection technique: Single-shot  Needle Type: Other      Needle Gauge: 20     Additional Needles:   Procedures:,,,,, intact distal pulses, Esmarch exsanguination, single tourniquet utilized,  Narrative:   Performed by: Personally       

## 2020-01-08 NOTE — Op Note (Signed)
NAME: RUBY DILONE MEDICAL RECORD NO: [id number] DATE OF BIRTH: Aug 17, 1960 FAC[facility] LOCATION: [facility] PHYSICIAN: Wynonia Sours, MD   OPERATIVE REPORT   DATE OF PROCEDURE: 01/08/20    PREOPERATIVE DIAGNOSIS:   Carpal tunnel syndrome left hand   POSTOPERATIVE DIAGNOSIS:   Same   PROCEDURE:   Decompression median nerve left hand   SURGEON: Daryll Brod, M.D.   ASSISTANT: none   ANESTHESIA:  Bier block with sedation and Local   INTRAVENOUS FLUIDS:  Per anesthesia flow sheet.   ESTIMATED BLOOD LOSS:  Minimal.   COMPLICATIONS:  None.   SPECIMENS:  none   TOURNIQUET TIME:    Total Tourniquet Time Documented: Forearm (Left) - 28 minutes Total: Forearm (Left) - 28 minutes    DISPOSITION:  Stable to PACU.   INDICATIONS: Patient is a 59 year old male with history of numbness and tingling in his hands nerve conductions are positive for carpal tunnel syndrome does not respond to conservative treatment he has elected undergo surgical decompression of the left median nerve.  Preperi-and postoperative course been discussed along with risks and complications.  He is aware there is no guarantee to the surgery the possibility of infection recurrence injury to arteries nerves tendons incomplete relief of symptoms distally.  In the preoperative area the patient is seen the extremity marked by both patient and surgeon antibiotic given  OPERATIVE COURSE: Patient brought the operating room where form based IV regional anesthetic was carried out without difficulty under the direction the anesthesia department after placing patient in a supine position with the left arm free.  He was and draped with ChloraPrep a 3-minute dry time was allowed and a timeout taken to confirm patient procedure.  A longitudinal incision was then made in the left palm carried down through subcutaneous tissue.  Bleeders were electrocauterized with bipolar.  The palmar fascia was split.   Superficial palmar arch was identified along with flexor tendon of the ring little finger.  Retractors were placed retracting median nerve and flexor tendons radially ulnar nerve ulnarly.  The flexor retinaculum was then released on its ulnar border.  A right angle sectsee retractor and stool retractor were then placed between skin and forearm fascia deep structures were dissected free with blunt dissection.  The flexor retinaculum proximal aspect distal forearm fascia was then released for approximately 2 to 3 cm proximal to the wrist crease under direct vision.  The canal was explored.  An area compression to the nerve was immediately apparent along with a significant hyperemia.  Motor branch had in the muscle distally.  The wound was copiously irrigated with saline.  The skin was closed interrupted 4 nylon sutures.  A sterile compressive dressing with fingers 3 was applied.  Inflation the tourniquet all fingers immediately pink.  He was taken to the recovery room for observation in satisfactory condition.  He will be discharged home to return to the hand center of [facility] in 1week tramadol.   Daryll Brod, MD Electronically signed, 01/08/20

## 2020-01-08 NOTE — Anesthesia Postprocedure Evaluation (Signed)
Anesthesia Post Note  Patient: Thomas Clark  Procedure(s) Performed: CARPAL TUNNEL RELEASE (Left Wrist)     Patient location during evaluation: PACU Anesthesia Type: Bier Block Level of consciousness: awake and alert Pain management: pain level controlled Vital Signs Assessment: post-procedure vital signs reviewed and stable Respiratory status: spontaneous breathing, nonlabored ventilation and respiratory function stable Cardiovascular status: blood pressure returned to baseline and stable Postop Assessment: no apparent nausea or vomiting Anesthetic complications: no   No complications documented.  Last Vitals:  Vitals:   01/08/20 1045 01/08/20 1100  BP: (!) 151/96 (!) 163/104  Pulse: 70 66  Resp: 16 16  Temp:  (!) 36.4 C  SpO2: 95% 95%    Last Pain:  Vitals:   01/08/20 1100  TempSrc:   PainSc: [place] Brendin Situ

## 2020-01-08 NOTE — Discharge Instructions (Addendum)

## 2020-01-08 NOTE — Brief Op Note (Signed)
01/08/2020  10:37 AM  PATIENT:  Thomas Clark  59 y.o. male  PRE-OPERATIVE DIAGNOSIS:  LEFT CARPAL TUNNEL SYNDROME  POST-OPERATIVE DIAGNOSIS:  LEFT CARPAL TUNNEL SYNDROME  PROCEDURE:  Procedure(s) with comments: CARPAL TUNNEL RELEASE (Left) - IV REGIONAL FOREARM BLOCK  SURGEON:  Surgeon(s) and Role:    * Daryll Brod, MD - Primary  PHYSICIAN ASSISTANT:   ASSISTANTS: none   ANESTHESIA:   local, regional and IV sedation  EBL 73mlBLOOD ADMINISTERED:none  DRAINS: none   LOCAL MEDICATIONS USED:  BUPIVICAINE   SPECIMEN:  No Specimen  DISPOSITION OF SPECIMEN:  N/A  COUNTS:  YES  TOURNIQUET:   Total Tourniquet Time Documented: Forearm (Left) - 28 minutes Total: Forearm (Left) - 28 minutes   DICTATION: .Viviann Spare Dictation  PLAN OF CARE: Discharge to home after PACU  PATIENT DISPOSITION:  PACU - hemodynamically stable.

## 2020-01-09 ENCOUNTER — Encounter (HOSPITAL_BASED_OUTPATIENT_CLINIC_OR_DEPARTMENT_OTHER): Payer: Self-pay | Admitting: Orthopedic Surgery

## 2020-03-24 ENCOUNTER — Other Ambulatory Visit: Payer: Self-pay | Admitting: Orthopedic Surgery

## 2020-03-24 DIAGNOSIS — R2232 Localized swelling, mass and lump, left upper limb: Secondary | ICD-10-CM

## 2020-03-28 ENCOUNTER — Ambulatory Visit
Admission: RE | Admit: 2020-03-28 | Discharge: 2020-03-28 | Disposition: A | Discharge status: Home / Self Care | Payer: BC Managed Care – PPO | Source: Ambulatory Visit | Attending: Orthopedic Surgery | Admitting: Orthopedic Surgery

## 2020-03-28 DIAGNOSIS — R2232 Localized swelling, mass and lump, left upper limb: Secondary | ICD-10-CM

## 2020-05-19 ENCOUNTER — Other Ambulatory Visit: Payer: Self-pay | Admitting: Orthopedic Surgery

## 2020-05-19 DIAGNOSIS — R2232 Localized swelling, mass and lump, left upper limb: Secondary | ICD-10-CM

## 2020-06-08 ENCOUNTER — Other Ambulatory Visit: Payer: BC Managed Care – PPO

## 2020-06-20 ENCOUNTER — Other Ambulatory Visit: Payer: Self-pay | Admitting: Orthopedic Surgery

## 2020-06-20 DIAGNOSIS — R2232 Localized swelling, mass and lump, left upper limb: Secondary | ICD-10-CM

## 2020-07-05 ENCOUNTER — Other Ambulatory Visit: Payer: Self-pay

## 2020-07-05 ENCOUNTER — Ambulatory Visit
Admission: RE | Admit: 2020-07-05 | Discharge: 2020-07-05 | Disposition: A | Discharge status: Home / Self Care | Payer: BC Managed Care – PPO | Source: Ambulatory Visit | Attending: Orthopedic Surgery | Admitting: Orthopedic Surgery

## 2020-07-05 DIAGNOSIS — R2232 Localized swelling, mass and lump, left upper limb: Secondary | ICD-10-CM

## 2020-07-11 ENCOUNTER — Other Ambulatory Visit: Payer: Self-pay | Admitting: Orthopedic Surgery

## 2020-08-12 ENCOUNTER — Encounter (HOSPITAL_BASED_OUTPATIENT_CLINIC_OR_DEPARTMENT_OTHER): Payer: Self-pay | Admitting: Orthopedic Surgery

## 2020-08-12 ENCOUNTER — Other Ambulatory Visit: Payer: Self-pay

## 2020-08-25 ENCOUNTER — Encounter (HOSPITAL_BASED_OUTPATIENT_CLINIC_OR_DEPARTMENT_OTHER)
Admission: RE | Admit: 2020-08-25 | Discharge: 2020-08-25 | Disposition: A | Discharge status: Home / Self Care | Payer: BC Managed Care – PPO | Source: Ambulatory Visit | Attending: Orthopedic Surgery | Admitting: Orthopedic Surgery

## 2020-08-25 DIAGNOSIS — F17200 Nicotine dependence, unspecified, uncomplicated: Secondary | ICD-10-CM | POA: Diagnosis not present

## 2020-08-25 DIAGNOSIS — Z01818 Encounter for other preprocedural examination: Secondary | ICD-10-CM | POA: Insufficient documentation

## 2020-08-25 DIAGNOSIS — Z96653 Presence of artificial knee joint, bilateral: Secondary | ICD-10-CM | POA: Diagnosis not present

## 2020-08-25 DIAGNOSIS — Z791 Long term (current) use of non-steroidal anti-inflammatories (NSAID): Secondary | ICD-10-CM | POA: Diagnosis not present

## 2020-08-25 DIAGNOSIS — D1722 Benign lipomatous neoplasm of skin and subcutaneous tissue of left arm: Secondary | ICD-10-CM | POA: Diagnosis not present

## 2020-08-25 DIAGNOSIS — Z886 Allergy status to analgesic agent status: Secondary | ICD-10-CM | POA: Diagnosis not present

## 2020-08-25 DIAGNOSIS — Z6841 Body Mass Index (BMI) 40.0 and over, adult: Secondary | ICD-10-CM | POA: Diagnosis not present

## 2020-08-25 DIAGNOSIS — Z79899 Other long term (current) drug therapy: Secondary | ICD-10-CM | POA: Diagnosis not present

## 2020-08-25 LAB — BASIC METABOLIC PANEL
Anion gap: 9 (ref 5–15)
BUN: 12 mg/dL (ref 8–23)
CO2: 31 mmol/L (ref 22–32)
Calcium: 9.3 mg/dL (ref 8.9–10.3)
Chloride: 100 mmol/L (ref 98–111)
Creatinine, Ser: 0.8 mg/dL (ref 0.61–1.24)
GFR, Estimated: >60 mL/min (ref >60)
Glucose, Bld: 82 mg/dL (ref 70–99)
Potassium: 4.9 mmol/L (ref 3.5–5.1)
Sodium: 140 mmol/L (ref 135–145)

## 2020-08-28 ENCOUNTER — Other Ambulatory Visit: Payer: Self-pay

## 2020-08-28 ENCOUNTER — Encounter (HOSPITAL_BASED_OUTPATIENT_CLINIC_OR_DEPARTMENT_OTHER): Payer: Self-pay | Admitting: Orthopedic Surgery

## 2020-08-28 ENCOUNTER — Ambulatory Visit (HOSPITAL_BASED_OUTPATIENT_CLINIC_OR_DEPARTMENT_OTHER): Payer: BC Managed Care – PPO | Admitting: Anesthesiology

## 2020-08-28 ENCOUNTER — Ambulatory Visit (HOSPITAL_BASED_OUTPATIENT_CLINIC_OR_DEPARTMENT_OTHER)
Admission: RE | Admit: 2020-08-28 | Discharge: 2020-08-28 | Disposition: A | Discharge status: Home / Self Care | Payer: BC Managed Care – PPO | Attending: Orthopedic Surgery | Admitting: Orthopedic Surgery

## 2020-08-28 ENCOUNTER — Encounter (HOSPITAL_BASED_OUTPATIENT_CLINIC_OR_DEPARTMENT_OTHER)
Admission: RE | Disposition: A | Discharge status: Home / Self Care | Payer: Self-pay | Source: Home / Self Care | Attending: Orthopedic Surgery

## 2020-08-28 DIAGNOSIS — Z6841 Body Mass Index (BMI) 40.0 and over, adult: Secondary | ICD-10-CM | POA: Insufficient documentation

## 2020-08-28 DIAGNOSIS — D1722 Benign lipomatous neoplasm of skin and subcutaneous tissue of left arm: Secondary | ICD-10-CM | POA: Diagnosis not present

## 2020-08-28 DIAGNOSIS — Z886 Allergy status to analgesic agent status: Secondary | ICD-10-CM | POA: Insufficient documentation

## 2020-08-28 DIAGNOSIS — Z79899 Other long term (current) drug therapy: Secondary | ICD-10-CM | POA: Insufficient documentation

## 2020-08-28 DIAGNOSIS — F17200 Nicotine dependence, unspecified, uncomplicated: Secondary | ICD-10-CM | POA: Insufficient documentation

## 2020-08-28 DIAGNOSIS — Z96653 Presence of artificial knee joint, bilateral: Secondary | ICD-10-CM | POA: Insufficient documentation

## 2020-08-28 DIAGNOSIS — Z791 Long term (current) use of non-steroidal anti-inflammatories (NSAID): Secondary | ICD-10-CM | POA: Insufficient documentation

## 2020-08-28 HISTORY — PX: MASS EXCISION: SHX2000

## 2020-08-28 SURGERY — EXCISION MASS
Anesthesia: Monitor Anesthesia Care | Site: Elbow | Laterality: Left

## 2020-08-28 MED ORDER — PROPOFOL 500 MG/50ML IV EMUL
INTRAVENOUS | Status: DC | PRN
  Administered 2020-08-28: 50 ug/kg/min via INTRAVENOUS

## 2020-08-28 MED ORDER — FENTANYL CITRATE (PF) 100 MCG/2ML IJ SOLN
INTRAMUSCULAR | Status: AC
  Filled 2020-08-28: qty 2

## 2020-08-28 MED ORDER — MIDAZOLAM HCL 2 MG/2ML IJ SOLN
2.0000 mg | Freq: Once | INTRAMUSCULAR | Status: AC
  Administered 2020-08-28: 2 mg via INTRAVENOUS

## 2020-08-28 MED ORDER — ONDANSETRON HCL 4 MG/2ML IJ SOLN
INTRAMUSCULAR | Status: AC
  Filled 2020-08-28: qty 2

## 2020-08-28 MED ORDER — LACTATED RINGERS IV SOLN: INTRAVENOUS | Status: DC

## 2020-08-28 MED ORDER — OXYCODONE HCL 5 MG PO TABS: 5.0000 mg | ORAL_TABLET | Freq: Once | ORAL | Status: DC | PRN

## 2020-08-28 MED ORDER — HYDROMORPHONE HCL 1 MG/ML IJ SOLN: 0.2500 mg | INTRAMUSCULAR | Status: DC | PRN

## 2020-08-28 MED ORDER — ROPIVACAINE HCL 5 MG/ML IJ SOLN
INTRAMUSCULAR | Status: DC | PRN
  Administered 2020-08-28: 30 mL via PERINEURAL

## 2020-08-28 MED ORDER — OXYCODONE HCL 5 MG/5ML PO SOLN: 5.0000 mg | Freq: Once | ORAL | Status: DC | PRN

## 2020-08-28 MED ORDER — MIDAZOLAM HCL 2 MG/2ML IJ SOLN
INTRAMUSCULAR | Status: AC
  Filled 2020-08-28: qty 2

## 2020-08-28 MED ORDER — FENTANYL CITRATE (PF) 100 MCG/2ML IJ SOLN
100.0000 ug | Freq: Once | INTRAMUSCULAR | Status: AC
  Administered 2020-08-28: 100 ug via INTRAVENOUS

## 2020-08-28 MED ORDER — CEFAZOLIN SODIUM-DEXTROSE 1-4 GM/50ML-% IV SOLN
INTRAVENOUS | Status: AC
  Filled 2020-08-28: qty 50

## 2020-08-28 MED ORDER — DEXMEDETOMIDINE (PRECEDEX) IN NS 20 MCG/5ML (4 MCG/ML) IV SYRINGE
PREFILLED_SYRINGE | INTRAVENOUS | Status: DC | PRN
  Administered 2020-08-28 (×2): 8 ug via INTRAVENOUS
  Administered 2020-08-28: 4 ug via INTRAVENOUS

## 2020-08-28 MED ORDER — PROPOFOL 500 MG/50ML IV EMUL
INTRAVENOUS | Status: AC
  Filled 2020-08-28: qty 50

## 2020-08-28 MED ORDER — DEXMEDETOMIDINE (PRECEDEX) IN NS 20 MCG/5ML (4 MCG/ML) IV SYRINGE
PREFILLED_SYRINGE | INTRAVENOUS | Status: AC
  Filled 2020-08-28: qty 5

## 2020-08-28 MED ORDER — HYDROCODONE-ACETAMINOPHEN 5-325 MG PO TABS: 1.0000 | ORAL_TABLET | Freq: Four times a day (QID) | ORAL | 0 refills | Status: AC | PRN

## 2020-08-28 MED ORDER — CEFAZOLIN IN SODIUM CHLORIDE 3-0.9 GM/100ML-% IV SOLN
3.0000 g | INTRAVENOUS | Status: AC
  Administered 2020-08-28: 3 g via INTRAVENOUS

## 2020-08-28 MED ORDER — PROMETHAZINE HCL 25 MG/ML IJ SOLN: 6.2500 mg | INTRAMUSCULAR | Status: DC | PRN

## 2020-08-28 MED ORDER — CEFAZOLIN SODIUM-DEXTROSE 2-4 GM/100ML-% IV SOLN: 2.0000 g | INTRAVENOUS | Status: DC

## 2020-08-28 MED ORDER — CEFAZOLIN SODIUM-DEXTROSE 2-4 GM/100ML-% IV SOLN
INTRAVENOUS | Status: AC
  Filled 2020-08-28: qty 100

## 2020-08-28 MED ORDER — ONDANSETRON HCL 4 MG/2ML IJ SOLN
INTRAMUSCULAR | Status: DC | PRN
  Administered 2020-08-28: 4 mg via INTRAVENOUS

## 2020-08-28 SURGICAL SUPPLY — 43 items
APL PRP STRL LF DISP 70% ISPRP (MISCELLANEOUS) ×1
BLADE SURG 15 STRL LF DISP TIS (BLADE) ×1 IMPLANT
BLADE SURG 15 STRL SS (BLADE) ×3
BNDG CMPR 9X4 STRL LF SNTH (GAUZE/BANDAGES/DRESSINGS) ×1
BNDG COHESIVE 1X5 TAN STRL LF (GAUZE/BANDAGES/DRESSINGS) IMPLANT
BNDG COHESIVE 2X5 TAN STRL LF (GAUZE/BANDAGES/DRESSINGS) IMPLANT
BNDG COHESIVE 3X5 TAN STRL LF (GAUZE/BANDAGES/DRESSINGS) IMPLANT
BNDG ESMARK 4X9 LF (GAUZE/BANDAGES/DRESSINGS) ×3 IMPLANT
BNDG GAUZE ELAST 4 BULKY (GAUZE/BANDAGES/DRESSINGS) ×3 IMPLANT
CHLORAPREP W/TINT 26 (MISCELLANEOUS) ×3 IMPLANT
CORD BIPOLAR FORCEPS 12FT (ELECTRODE) ×3 IMPLANT
COVER BACK TABLE 60X90IN (DRAPES) ×3 IMPLANT
COVER MAYO STAND STRL (DRAPES) ×3 IMPLANT
CUFF TOURN SGL QUICK 18X4 (TOURNIQUET CUFF) ×3 IMPLANT
DECANTER SPIKE VIAL GLASS SM (MISCELLANEOUS) IMPLANT
DRAIN PENROSE 1/2X12 LTX STRL (WOUND CARE) IMPLANT
DRAPE EXTREMITY T 121X128X90 (DISPOSABLE) ×3 IMPLANT
DRAPE SURG 17X23 STRL (DRAPES) ×3 IMPLANT
GAUZE SPONGE 4X4 12PLY STRL (GAUZE/BANDAGES/DRESSINGS) ×3 IMPLANT
GAUZE XEROFORM 1X8 LF (GAUZE/BANDAGES/DRESSINGS) ×3 IMPLANT
GLOVE SURG ENC MOIS LTX SZ7.5 (GLOVE) ×3 IMPLANT
GLOVE SURG ORTHO LTX SZ8 (GLOVE) ×6 IMPLANT
GLOVE SURG UNDER POLY LF SZ8.5 (GLOVE) ×3 IMPLANT
GOWN STRL REUS W/ TWL LRG LVL3 (GOWN DISPOSABLE) ×1 IMPLANT
GOWN STRL REUS W/TWL LRG LVL3 (GOWN DISPOSABLE) ×3
GOWN STRL REUS W/TWL XL LVL3 (GOWN DISPOSABLE) ×6 IMPLANT
NDL SAFETY ECLIPSE 18X1.5 (NEEDLE) IMPLANT
NEEDLE HYPO 18GX1.5 SHARP (NEEDLE)
NEEDLE PRECISIONGLIDE 27X1.5 (NEEDLE) IMPLANT
NS IRRIG 1000ML POUR BTL (IV SOLUTION) ×3 IMPLANT
PACK BASIN DAY SURGERY FS (CUSTOM PROCEDURE TRAY) ×3 IMPLANT
PAD CAST 3X4 CTTN HI CHSV (CAST SUPPLIES) ×1 IMPLANT
PADDING CAST COTTON 3X4 STRL (CAST SUPPLIES) ×3
SLING ARM FOAM STRAP XLG (SOFTGOODS) ×3 IMPLANT
SPLINT PLASTER CAST XFAST 3X15 (CAST SUPPLIES) IMPLANT
SPLINT PLASTER XTRA FASTSET 3X (CAST SUPPLIES)
STOCKINETTE 4X48 STRL (DRAPES) ×3 IMPLANT
SUT ETHILON 4 0 PS 2 18 (SUTURE) ×3 IMPLANT
SUT VIC AB 4-0 P2 18 (SUTURE) IMPLANT
SYR BULB EAR ULCER 3OZ GRN STR (SYRINGE) ×3 IMPLANT
SYR CONTROL 10ML LL (SYRINGE) ×3 IMPLANT
TOWEL GREEN STERILE FF (TOWEL DISPOSABLE) ×3 IMPLANT
UNDERPAD 30X36 HEAVY ABSORB (UNDERPADS AND DIAPERS) ×3 IMPLANT

## 2020-08-28 NOTE — Op Note (Signed)
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   ASSISTANT: Leanora Cover, MD   ANESTHESIA:  Regional with sedation   INTRAVENOUS FLUIDS:  Per anesthesia flow sheet.   ESTIMATED BLOOD LOSS:  Minimal.   COMPLICATIONS:  None.   SPECIMENS: Mass   TOURNIQUET TIME:    Total Tourniquet Time Documented: Upper Arm (Left) - 22 minutes Total: Upper Arm (Left) - 22 minutes    DISPOSITION:  Stable to PACU.   INDICATIONS: Patient is a 60 year old male with history of a mass in his left antecubital fossa MRI reveals a probable lipoma 5 x 2 cm in size.  He is desires having this excised.  Prepare postoperative course been discussed along with risks and complications he is aware there is no guarantee to the surgery the possibility of infection recurrence injury to arteries nerves tendons complete relief symptoms dystrophy.  Preoperative area the patient seen the extremity marked by both patient and surgeon antibiotic given.  A supraclavicular block was carried out without difficulty under the direction the anesthesia department.  OPERATIVE COURSE: Patient is brought to the operating room placed in a supine position with left arm free.  Prepped and draped with ChloraPrep.  3-minute dry time was allowed and timeout taken to confirm patient procedure.  The limb was exsanguinated with an Esmarch bandage turn placed high in the arm was inflated to 250 mmHg.  A longitudinal incision was made in the antecubital fossa left forearm carried down through subcutaneous tissue.  A fatty tissue was immediately encountered.  This was adjacent and abutting the brachial radialis  muscle.  There were 2 types of fat.  1 was white the other typical fat yellow in nature decided to excise the entire area.  Blunt sharp dissection this was gradually dissected free was found that there is yellow fatty tissue had infiltrated into the brachial radialis muscle some portions of the brachial radialis muscle were sacrificed and sent with the pathology.  The entire mass was excised.  Bleeders were electrocauterized the entire specimen measured approximately 8 cm x 3 cm the area was copiously irrigated with saline.  The subcutaneous tissue was then closed erupted 4-0 Vicryl sutures and the skin was closed with interrupted 4-0 nylon sutures a sterile compressive dressing was applied and deflation of the tourniquet all fingers immediately pink.  He was taken to the recovery room for observation in satisfactory condition.  He will be discharged home to return to the hand center [facility] in 1 week on Tylenol or ibuprofen with Norco for breakthrough.   Daryll Brod, MD Electronically signed, 08/28/20 Probable lipoma left antecubital fossa

## 2020-08-28 NOTE — Op Note (Signed)
I assisted Surgeon(s) and Role:    * Daryll Brod, MD - Primary    Leanora Cover, MD - Assisting on the Procedure(s): EXCISIONAL BIOPSY LEFT ANTECUBITAL FOSSA MASS on 08/28/2020.  I provided assistance on this case as follows: retraction soft tissues.  Electronically signed by: Leanora Cover, MD Date: 08/28/2020 Time: 12:36 PM

## 2020-08-28 NOTE — Anesthesia Procedure Notes (Signed)
Anesthesia Regional Block: Supraclavicular block   Pre-Anesthetic Checklist: , timeout performed,  Correct Patient, Correct Site, Correct Laterality,  Correct Procedure, Correct Position, site marked,  Risks and benefits discussed,  Surgical consent,  Pre-op evaluation,  At surgeon's request and post-op pain management  Laterality: Left  Prep: chloraprep       Needles:  Injection technique: Single-shot  Needle Type: Stimiplex     Needle Length: 9cm  Needle Gauge: 21     Additional Needles:   Procedures:,,,, ultrasound used (permanent image in chart),,    Narrative:  Start time: 08/28/2020 11:14 AM End time: 08/28/2020 11:19 AM Injection made incrementally with aspirations every 5 mL.  Performed by: Personally  Anesthesiologist: Lynda Rainwater, MD

## 2020-08-28 NOTE — Brief Op Note (Signed)
08/28/2020  12:36 PM  PATIENT:  Thomas Clark  60 y.o. male  PRE-OPERATIVE DIAGNOSIS:  MASS LEFT ELBOW  POST-OPERATIVE DIAGNOSIS:  MASS LEFT ELBOW  PROCEDURE:  Procedure(s) with comments: EXCISIONAL BIOPSY LEFT ANTECUBITAL FOSSA MASS (Left) - AXILLARY BLOCK  SURGEON:  Surgeon(s) and Role:    * Daryll Brod, MD - Primary    * Leanora Cover, MD - Assisting  PHYSICIAN ASSISTANT:   ASSISTANTS: K Merlinda Wrubel,MD   ANESTHESIA:   regional and IV sedation  EBL:  80ml   BLOOD ADMINISTERED:none  DRAINS: none   LOCAL MEDICATIONS USED:  NONE  SPECIMEN:  Excision  DISPOSITION OF SPECIMEN:  PATHOLOGY  COUNTS:  YES  TOURNIQUET:   Total Tourniquet Time Documented: Upper Arm (Left) - 22 minutes Total: Upper Arm (Left) - 22 minutes   DICTATION: .Viviann Spare Dictation  PLAN OF CARE: Discharge to home after PACU  PATIENT DISPOSITION:  PACU - hemodynamically stable.

## 2020-08-28 NOTE — Discharge Instructions (Addendum)

## 2020-08-28 NOTE — Transfer of Care (Signed)
Immediate Anesthesia Transfer of Care Note  Patient: Thomas Clark  Procedure(s) Performed: EXCISIONAL BIOPSY LEFT ANTECUBITAL FOSSA MASS (Left: Elbow)  Patient Location: PACU  Anesthesia Type:MAC combined with regional for post-op pain  Level of Consciousness: awake, alert  and oriented  Airway & Oxygen Therapy: Patient Spontanous Breathing and Patient connected to face mask oxygen  Post-op Assessment: Report given to RN and Post -op Vital signs reviewed and stable  Post vital signs: Reviewed and stable  Last Vitals:  Vitals Value Taken Time  BP    Temp    Pulse 68 08/28/20 1240  Resp 21 08/28/20 1240  SpO2 96 % 08/28/20 1240  Vitals shown include unvalidated device data.  Last Pain:  Vitals:   08/28/20 0922  TempSrc: Oral  PainSc: 5          Complications: No notable events documented.

## 2020-08-28 NOTE — Progress Notes (Signed)
Assisted Dr. Miller with left, ultrasound guided, supraclavicular block. Side rails up, monitors on throughout procedure. See vital signs in flow sheet. Tolerated Procedure well. 

## 2020-08-28 NOTE — Anesthesia Preprocedure Evaluation (Signed)
Anesthesia Evaluation  Patient identified by MRN, date of birth, ID band Patient awake    Reviewed: Allergy & Precautions, H&P , NPO status , Patient's Chart, lab work & pertinent test results  Airway Mallampati: III  TM Distance: >3 FB Neck ROM: Full    Dental no notable dental hx. (+) Teeth Intact, Dental Advisory Given   Pulmonary neg pulmonary ROS, sleep apnea , Current Smoker and Patient abstained from smoking.,    Pulmonary exam normal breath sounds clear to auscultation       Cardiovascular hypertension, Pt. on medications negative cardio ROS   Rhythm:Regular Rate:Normal     Neuro/Psych negative neurological ROS  negative psych ROS   GI/Hepatic negative GI ROS, Neg liver ROS,   Endo/Other  negative endocrine ROSMorbid obesity  Renal/GU negative Renal ROS  negative genitourinary   Musculoskeletal negative musculoskeletal ROS (+) Arthritis , Osteoarthritis,    Abdominal (+) + obese,   Peds negative pediatric ROS (+)  Hematology negative hematology ROS (+)   Anesthesia Other Findings   Reproductive/Obstetrics negative OB ROS                             Anesthesia Physical  Anesthesia Plan  ASA: III  Anesthesia Plan: MAC and Regional   Post-op Pain Management:    Induction: Intravenous  PONV Risk Score and Plan: 1 and Treatment may vary due to age or medical condition and Ondansetron  Airway Management Planned: Simple Face Mask  Additional Equipment:   Intra-op Plan:   Post-operative Plan:   Informed Consent: I have reviewed the patients History and Physical, chart, labs and discussed the procedure including the risks, benefits and alternatives for the proposed anesthesia with the patient or authorized representative who has indicated his/her understanding and acceptance.     Dental advisory given  Plan Discussed with: CRNA  Anesthesia Plan Comments: (Patient no  longer meets our Day surgery criteria due to BMI of 55+  Patient, not brought in prior for weight check because of [place] closure.  Agree to proceed today due to factors that were out of the patient's control.)        Anesthesia Quick Evaluation

## 2020-08-28 NOTE — Anesthesia Postprocedure Evaluation (Signed)
Anesthesia Post Note  Patient: Thomas Clark  Procedure(s) Performed: EXCISIONAL BIOPSY LEFT ANTECUBITAL FOSSA MASS (Left: Elbow)     Patient location during evaluation: PACU Anesthesia Type: Regional and MAC Level of consciousness: awake and alert Pain management: pain level controlled Vital Signs Assessment: post-procedure vital signs reviewed and stable Respiratory status: spontaneous breathing, nonlabored ventilation and respiratory function stable Cardiovascular status: blood pressure returned to baseline and stable Postop Assessment: no apparent nausea or vomiting Anesthetic complications: no   No notable events documented.  Last Vitals:  Vitals:   08/28/20 1300 08/28/20 1332  BP:  117/76  Pulse: 68 64  Resp: 17 18  Temp:  36.6 C  SpO2: 97% 96%    Last Pain:  Vitals:   08/28/20 1319  TempSrc:   PainSc: 0-No pain                 Lynda Rainwater

## 2020-08-28 NOTE — H&P (Signed)
Thomas Clark is an 60 y.o. male.   Chief Complaint: mass left arm HPI: Thomas Clark is a 60 year old right-hand-dominant male with mass left elbow. He was last seen ollowing a ultrasound revealing a large intramuscular lipoma and was sent for MRI. This has been done and read out by Dr. Gale Journey revealing a probable lipoma 5.1 x 2.7 x 1.6 cm partially within the brachial radialis muscle. He has additional biceps and triceps tendinitis some age-related change in the radial neck and lateral epicondylitis. He is not complain of any pain at the elbow has just realized the mass is there.he has no history of diabetes thyroid problems or gout. [place] have a history of arthritis. Family history is the same.   Past Medical History:  Diagnosis Date   Ankle fracture 1974   "put a cast on it"   Arthritis    "feet, legs" (03/26/2016)   High cholesterol    Hypertension    Obesity    Primary localized osteoarthritis of left knee 03/26/2016   Primary localized osteoarthritis of right knee 11/02/2016   Severe obesity (BMI >= 40) ([place]) 03/26/2016   Sleep apnea    Umbilical hernia     Past Surgical History:  Procedure Laterality Date   CARPAL TUNNEL RELEASE Left 01/08/2020   Procedure: CARPAL TUNNEL RELEASE;  Surgeon: Daryll Brod, MD;  Location: [place];  Service: Orthopedics;  Laterality: Left;  IV REGIONAL FOREARM BLOCK   COLONOSCOPY W/ BIOPSIES AND POLYPECTOMY     HERNIA REPAIR  1985   "stomach"   PARTIAL KNEE ARTHROPLASTY Left 03/26/2016   Procedure: UNICOMPARTMENTAL KNEE ARTHROPLASTY;  Surgeon: Marchia Bond, MD;  Location: [place];  Service: Orthopedics;  Laterality: Left;   PARTIAL KNEE ARTHROPLASTY Right 11/02/2016   Procedure: UNICOMPARTMENTAL RIGHT KNEE;  Surgeon: Marchia Bond, MD;  Location: [place];  Service: Orthopedics;  Laterality: Right;   REPLACEMENT UNICONDYLAR JOINT KNEE Left 03/26/2016    History reviewed. No pertinent family history. Social History:  reports that he has an  unknown smoking status. He has never been exposed to tobacco smoke. He has never used smokeless tobacco. He reports current alcohol use. He reports that he does not use drugs.  Allergies:  Allergies  Allergen Reactions   Aleve [Naproxen Sodium] Other (See Comments)    GI bleeds    Medications Prior to Admission  Medication Sig Dispense Refill   amLODipine (NORVASC) 10 MG tablet Take 10 mg by mouth daily.     celecoxib (CELEBREX) 200 MG capsule Take 200 mg by mouth daily.     cetirizine (ZYRTEC) 10 MG tablet Take 10 mg by mouth daily.     clonazePAM (KLONOPIN) 2 MG tablet Take 2 mg by mouth 2 (two) times daily.     DULoxetine (CYMBALTA) 60 MG capsule Take 60 mg by mouth at bedtime.     lisinopril-hydrochlorothiazide (PRINZIDE,ZESTORETIC) 20-12.5 MG tablet Take 1 tablet by mouth 2 (two) times daily.     methocarbamol (ROBAXIN) 500 MG tablet Take 1 tablet (500 mg total) by mouth every 8 (eight) hours as needed for muscle spasms. 50 tablet 0   metoprolol succinate (TOPROL-XL) 100 MG 24 hr tablet Take 100 mg by mouth daily. Take with or immediately following a meal.     modafinil (PROVIGIL) 100 MG tablet Take 300 mg by mouth daily.     simvastatin (ZOCOR) 40 MG tablet Take 40 mg by mouth daily at 6 PM.     testosterone (ANDROGEL) 50 MG/5GM (1%) GEL Place  5 g onto the skin daily.      No results found for this or any previous visit (from the past 48 hour(s)).  No results found.   Pertinent items are noted in HPI.  Blood pressure (!) 134/94, pulse 83, temperature 98.2 F (36.8 C), temperature source Oral, resp. rate 18, height 5\' 10"  (1.778 m), weight (!) 174.1 kg, SpO2 95 %.  General appearance: alert, cooperative, and appears stated age Head: Normocephalic, without obvious abnormality Neck: no JVD Resp: clear to auscultation bilaterally Cardio: regular rate and rhythm, S1, S2 normal, no murmur, click, rub or gallop GI: soft, non-tender; bowel sounds normal; no masses,  no  organomegaly Extremities:  mass radil proximal forearm Pulses: 2+ and symmetric Skin: Skin color, texture, turgor normal. No rashes or lesions Neurologic: Grossly normal Incision/Wound: na  Assessment/Plan Assessment:  1. Mass of left elbow    Plan: He would like to have this removed. Prepare postoperative course been discussed along with risk and complications. He is aware there is no guarantee to the surgery the possibility of infection recurrence injury to arteries nerves tendons incomplete relief symptoms dystrophy possibility that this may be more than a benign lesion. This will be scheduled as an outpatient under regional anesthesia.   Daryll Brod 08/28/2020, 10:24 AM

## 2020-08-29 LAB — SURGICAL PATHOLOGY

## 2020-08-31 ENCOUNTER — Encounter (HOSPITAL_BASED_OUTPATIENT_CLINIC_OR_DEPARTMENT_OTHER): Payer: Self-pay | Admitting: Orthopedic Surgery
